# Patient Record
Sex: Female | Born: 1945 | Race: White | Hispanic: No | Marital: Married | Smoking: Never smoker
Health system: Southern US, Community
[De-identification: ages and names within clinical notes are randomized; demographics above are authoritative.]

## PROBLEM LIST (undated history)

## (undated) DIAGNOSIS — K219 Gastro-esophageal reflux disease without esophagitis: Secondary | ICD-10-CM

## (undated) DIAGNOSIS — I10 Essential (primary) hypertension: Secondary | ICD-10-CM

## (undated) DIAGNOSIS — E079 Disorder of thyroid, unspecified: Secondary | ICD-10-CM

## (undated) DIAGNOSIS — E7800 Pure hypercholesterolemia, unspecified: Secondary | ICD-10-CM

## (undated) HISTORY — PX: SHOULDER SURGERY: SHX246

## (undated) HISTORY — PX: ELBOW ARTHROSCOPY: SUR87

## (undated) HISTORY — PX: COLONOSCOPY: SHX174

## (undated) HISTORY — PX: ABDOMINAL HYSTERECTOMY: SUR658

## (undated) HISTORY — PX: CHOLECYSTECTOMY: SHX55

## (undated) HISTORY — PX: FOOT TENDON SURGERY: SHX958

## (undated) HISTORY — PX: FINGER SURGERY: SHX640

---

## 2003-09-23 ENCOUNTER — Other Ambulatory Visit: Admission: RE | Admit: 2003-09-23 | Discharge: 2003-09-23 | Payer: Self-pay | Admitting: Gynecology

## 2003-11-14 ENCOUNTER — Ambulatory Visit (HOSPITAL_BASED_OUTPATIENT_CLINIC_OR_DEPARTMENT_OTHER): Admission: RE | Admit: 2003-11-14 | Discharge: 2003-11-14 | Payer: Self-pay | Admitting: Orthopedic Surgery

## 2003-11-14 ENCOUNTER — Ambulatory Visit (HOSPITAL_COMMUNITY): Admission: RE | Admit: 2003-11-14 | Discharge: 2003-11-14 | Payer: Self-pay | Admitting: Orthopedic Surgery

## 2004-03-26 ENCOUNTER — Ambulatory Visit (HOSPITAL_BASED_OUTPATIENT_CLINIC_OR_DEPARTMENT_OTHER): Admission: RE | Admit: 2004-03-26 | Discharge: 2004-03-26 | Payer: Self-pay | Admitting: Orthopedic Surgery

## 2004-03-26 ENCOUNTER — Ambulatory Visit (HOSPITAL_COMMUNITY): Admission: RE | Admit: 2004-03-26 | Discharge: 2004-03-26 | Payer: Self-pay | Admitting: Orthopedic Surgery

## 2006-01-31 ENCOUNTER — Ambulatory Visit: Payer: Self-pay | Admitting: Gastroenterology

## 2006-02-16 ENCOUNTER — Ambulatory Visit: Payer: Self-pay | Admitting: Gastroenterology

## 2006-03-18 ENCOUNTER — Ambulatory Visit: Payer: Self-pay | Admitting: Gastroenterology

## 2008-07-03 ENCOUNTER — Encounter: Admission: RE | Admit: 2008-07-03 | Discharge: 2008-07-03 | Payer: Self-pay | Admitting: Radiology

## 2010-10-02 NOTE — Assessment & Plan Note (Signed)
[facility]                           GASTROENTEROLOGY OFFICE NOTE   Carmen [name], Carmen [name]                        MRN:          [id number]  DATE:01/31/2006                            DOB:          Mar 31, 1946    PROBLEM:  Diarrhea.   Carmen [name] is a pleasant 65 year old white female complaining of  intermittent urgency with diarrheal stools.  This may occur without warning.  It is not related to specific foods.  It may occur up to twice a week.  There is no history of melena or hematochezia.  She is without abdominal  pain.  Medicines have not changed.  She has had symptoms over the last three  months that have worsened.   PAST MEDICAL HISTORY:  Pertinent for hypertension.  She is status-post  cholecystectomy, tubal ligation, and hysterectomy.   FAMILY HISTORY:  Pertinent for a daughter with breast cancer.   MEDICATIONS:  Include Levoxyl, Caduet, Benicar, Premarin.   SHE IS ALLERGIC TO PENICILLIN AND TETANUS.   She does not smoke.  She drinks rarely.  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   REVIEW OF SYSTEMS:  Positive for sleeping problems and recent anxiety and  depression related to her daughter's illness.   PHYSICAL EXAM:  GENERAL:  She is weepy.  VITAL SIGNS:  Pulse 88, blood pressure 118/76, weight 200.  HEENT: EOMI. PERRLA. Sclerae are anicteric.  Conjunctivae are pink.  NECK:  Supple without thyromegaly, adenopathy or carotid bruits.  CHEST:  Clear to auscultation and percussion without adventitious sounds.  CARDIAC:  Regular rhythm; normal S1 S2.  There are no murmurs, gallops or  rubs.  ABDOMEN:  Bowel sounds are normoactive.  Abdomen is soft, non-tender and non-  distended.  There are no abdominal masses, tenderness, splenic enlargement  or hepatomegaly.  EXTREMITIES:  Full range of motion.  No cyanosis, clubbing or edema.  RECTAL:  Deferred.   IMPRESSION:  1. 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  2. Anxiety/depression.   RECOMMENDATION:  1. Fiber supplementation.  2. Screening colonoscopy.  3. Begin a trial of Lexapro 10 mg daily.                                   Barbette Hair. Arlyce Dice, MD,FACG   RDK/MedQ  DD:  01/31/2006  DT:  01/31/2006  Job #:  [id number]   cc:   Teena Irani. Arlyce Dice, M.D.

## 2010-10-02 NOTE — Op Note (Signed)
NAMEMARENA, [name] NO.:  [id number]   MEDICAL RECORD NO.:  [id number]                   PATIENT TYPE:  AMB   LOCATION:  DSC                                  FACILITY:  [facility]   PHYSICIAN:  Loreta Ave, M.D.              DATE OF BIRTH:  1946-05-04   DATE OF PROCEDURE:  DATE OF DISCHARGE:                                 OPERATIVE REPORT   PREOPERATIVE DIAGNOSES:  1. 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  2. Prominent symptomatic posterior superior os calcis spur and pre-Achilles     bursitis.   POSTOPERATIVE DIAGNOSES:  1. 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  2. Prominent symptomatic posterior superior os calcis spur and pre-Achilles     bursitis.   OPERATIVE PROCEDURE:  1. Extensive debridement and then primary repair, Achilles tendon, with a #2     Fibrewire suture.  2. Excision, pre-Achilles bursa, and removal of symptomatic spur, posterior     superior os calcis.   SURGEON:  Loreta Ave, M.D.   ASSISTANT:  Arlys John D. Petrarca, P.A.-C.   ANESTHESIA:  General.   ESTIMATED BLOOD LOSS:  Minimal.   TOURNIQUET TIME:  45 minutes.   SPECIMENS:  None.   CULTURES:  None.   COMPLICATIONS:  None.   DRESSING:  Soft compressive with a short-leg splint in a plantargrade,  slightly plantar flexed position.   PROCEDURE:  Patient brought to the operating room and after adequate  anesthesia had been obtained, tourniquet applied to the upper aspect of the  right leg.  Turned to a prone position, appropriate padding and support.  Exsanguinated with elevation and Esmarch, tourniquet inflated to 350 mmHg.  A longitudinal incision centered over the area of fusiform swelling of the  Achilles tendon in the watershed region halfway between the musculotendinous  junction  and Achilles attachment.  The incision extended proximal and  distal.  Skin and subcutaneous tissue divided.  Thickened, inflammatory  peritendinous tissue excised.  Fusiform swelling, degeneration over almost  10 cm in the Achilles tendon, but leaving some healthy tendon above and  below this.  The tendon was opened longitudinally.  All of the areas of  partial tearing, inflammatory debris, mucinous degeneration completely  excised back to healthy tissue.  This left a thin rim of tendon all the way  around at completion but a fairly good defect over the central 2 or 3 cm.  At the distal end the pre-Achilles bursa region was exposed through the  slightly lateral incision.  Thickened, inflamed pre-Achilles bursa resected.  Prominent spurring off the os calcis exposed and then taken down, protecting  the distal tendon.  Fluoroscopic confirmation of adequate removal of the  spurs.  Although there was still a spur  up into the central portion of the  tendon, this is not symptomatic and I did not feel I needed to take the  tendon down from the attachment itself.  Wound thoroughly irrigated.  I then  did a repair by running a Fibrewire suture from the longitudinal defect well  proximally into the tendon above the defect with a tendon-capturing stitch.  I then weaved across the longitudinal opening in the tendon with a baseball  suture and then down distally into healthy tendon with a tendon-capturing  stitch, bringing the suture back up into the defect and then primarily  closing this.  This gave a nice, firm repair throughout and utilized the  healthy tendon above and below the defect to well capture and repair the  overall tendon.  At completion I could bring her slightly dorsiflexed  without too much tension on the repair, and I had a nice firm repair with a  nice contoured tendon throughout.  The wound was irrigated.  Closed with  Vicryl and then nylon.  Margins of the wound injected with  Marcaine.  A  sterile compressive dressing and short-leg splint applied.  Tourniquet  deflated and removed.  Returned to the supine position.  Anesthesia  reversed.  Brought to the recovery room.  Tolerated surgery well, no  complications.                                               Loreta Ave, M.D.    DFM/MEDQ  D:  11/14/2003  T:  11/14/2003  Job:  [id number]

## 2010-10-02 NOTE — Assessment & Plan Note (Signed)
[facility]                           GASTROENTEROLOGY OFFICE NOTE   LENNIX, ROTUNDO                        MRN:          [id number]  DATE:03/18/2006                            DOB:          09-17-1945    PROBLEM:  Diarrhea.   Ms. Milanese has returned for scheduled GI followup.  Colonoscopy demonstrated  diverticulosis only.  She claims that her diarrhea has entirely subsided.  She feels it is stress related.  She has stopped her fiber.   PHYSICAL EXAMINATION:  VITAL SIGNS:  Pulse 86, blood pressure 110/60.  Weight 196.   IMPRESSION:  1. Stress-related diarrhea.  2. Diverticulosis.   RECOMMENDATIONS:  Fiber supplementation as needed.     Barbette Hair. Arlyce Dice, MD,FACG  Electronically Signed    RDK/MedQ  DD: 03/18/2006  DT: 03/18/2006  Job #: [id number]   cc:   Titus Dubin. Alwyn Ren, MD,FACP,FCCP

## 2010-10-02 NOTE — Letter (Signed)
January 31, 2006     Mrs. Lovenia Debruler  [address]  [place], [place] [address]   MRN:  [id number]  /  DOB:  March 04, 1946   Dear Mrs. Izola Price:   It is my pleasure to have treated you recently as a new patient in my  office.  I appreciate your confidence and the opportunity to participate in  your care.   Since I do have a busy inpatient endoscopy schedule and office schedule, my  office hours vary weekly.  I am, however, available for emergency calls  every day through my office.  If I cannot promptly meet an urgent office  appointment, another one of our gastroenterologists will be able to assist  you.   My well-trained staff are prepared to help you at all times.  For  emergencies after office hours, a physician from our gastroenterology  section is always available through my 24-hour answering service.   While you are under my care, I encourage discussion of your questions and  concerns, and I will be happy to return your calls as soon as I am  available.   Once again, I welcome you as a new patient and I look forward to a happy and  healthy relationship.   Sincerely,     Barbette Hair. Arlyce Dice, MD,FACG   RDK/MedQ  DD:  01/31/2006  DT:  01/31/2006  Job #:  [id number]

## 2010-10-02 NOTE — Op Note (Signed)
Carmen [name], PINSON NO.:  [id number]   MEDICAL RECORD NO.:  [id number]          PATIENT TYPE:  AMB   LOCATION:  DSC                          FACILITY:  [facility]   PHYSICIAN:  Loreta Ave, M.D. DATE OF BIRTH:  [birth year]   DATE OF PROCEDURE:  03/26/2004  DATE OF DISCHARGE:  03/26/2004                                 OPERATIVE REPORT   PREOPERATIVE DIAGNOSIS:  Delayed staphylococcal infection, three months'  onset, after Achilles tendon repair, right posterior ankle.  Felt to be  superficial.   POSTOPERATIVE DIAGNOSIS:  Delayed staphylococcal infection, three months'  onset, after Achilles tendon repair, right posterior ankle.  Felt to be  superficial.  Extension of superficial infection down into portion of  Achilles tendon but without tendon disruption.   PROCEDURE:  Exploration, debridement, irrigation, lavage, and removal of  buried Fibrewire suture, right posterior ankle.  Loose primary closure over  a Penrose drain.   SURGEON:  Loreta Ave, M.D.   ASSISTANT:  Genene Churn. Denton Meek.   ANESTHESIA:  General.   ESTIMATED BLOOD LOSS:  Minimal.   SPECIMENS:  None.   CULTURES:  None.   COMPLICATIONS:  None.   DRESSING:  Soft compressive with a Cam walker.   TOURNIQUET TIME:  45 minutes.   PROCEDURE:  Patient brought to the operating room and after adequate  anesthesia had been obtained, appropriate padding and support, turned to a  prone position, prepped and draped in the usual sterile fashion.  Exsanguinated with elevation and Esmarch, tourniquet inflated to 300 mmHg.  The incision over the Achilles tendon was opened in the central portion,  elliptically excising three sinus tracts, two of which had an exuberant  amount of granulation tissue.  Just 0.5 mL of purulence emanating from the  subcutaneous tissue.  Once these were excised, the central portion of the  incision.  Extensive amount of necrotic debris, some cloudy fluid, but no  gross purulence.  Most of the necrotic debris was in the peritenon tissue.  Some did extend down into the Achilles with a scalloping superficial erosion  of the Achilles.  It also seemed to track down into where the Fibrewire  suture had been placed.  Her repair was distal, and this infection was at  the proximal end over the Achilles.  No disruption of the longitudinal  continuity.  Extensive irrigation of all necrotic debris.  Fibrewire suture  removed.  The tendon was completely curetted out of abnormal debris.  All of  the necrotic peritenon removed.  Copious irrigation with 6 L of saline with  pulse lavage.  The tourniquet was deflated.  Exuberant bleeding began around  the area, which was very encouraging for later healing.  The tourniquet was  reinflated, then the wound was re-irrigated with another 3 L of saline.  I  felt that I had removed the foreign body of the suture as well as all  necrotic debris.  I placed a Penrose drain over the tendon, bringing it out  proximally.  The skin and subcutaneous tissue were then closed with PDS  retention-type sutures.  Sterile compressive dressing applied.  Cam walker  applied.  Tourniquet removed.  Returned to the supine position.  Anesthesia  reversed.  Brought to the recovery room.  Tolerated the surgery well with no  complications.      Valentino Saxon   DFM/MEDQ  D:  03/27/2004  T:  03/28/2004  Job:  [id number]

## 2011-05-20 DIAGNOSIS — H524 Presbyopia: Secondary | ICD-10-CM | POA: Diagnosis not present

## 2011-05-20 DIAGNOSIS — H2510 Age-related nuclear cataract, unspecified eye: Secondary | ICD-10-CM | POA: Diagnosis not present

## 2011-06-16 DIAGNOSIS — E039 Hypothyroidism, unspecified: Secondary | ICD-10-CM | POA: Diagnosis not present

## 2011-06-16 DIAGNOSIS — R21 Rash and other nonspecific skin eruption: Secondary | ICD-10-CM | POA: Diagnosis not present

## 2011-06-16 DIAGNOSIS — I10 Essential (primary) hypertension: Secondary | ICD-10-CM | POA: Diagnosis not present

## 2011-06-16 DIAGNOSIS — E785 Hyperlipidemia, unspecified: Secondary | ICD-10-CM | POA: Diagnosis not present

## 2011-06-16 DIAGNOSIS — R7301 Impaired fasting glucose: Secondary | ICD-10-CM | POA: Diagnosis not present

## 2011-11-25 DIAGNOSIS — R920 Mammographic microcalcification found on diagnostic imaging of breast: Secondary | ICD-10-CM | POA: Diagnosis not present

## 2011-12-13 ENCOUNTER — Telehealth: Payer: Self-pay | Admitting: *Deleted

## 2011-12-13 DIAGNOSIS — K219 Gastro-esophageal reflux disease without esophagitis: Secondary | ICD-10-CM | POA: Diagnosis not present

## 2011-12-13 DIAGNOSIS — E785 Hyperlipidemia, unspecified: Secondary | ICD-10-CM | POA: Diagnosis not present

## 2011-12-13 DIAGNOSIS — I10 Essential (primary) hypertension: Secondary | ICD-10-CM | POA: Diagnosis not present

## 2011-12-13 DIAGNOSIS — E039 Hypothyroidism, unspecified: Secondary | ICD-10-CM | POA: Diagnosis not present

## 2011-12-13 NOTE — Telephone Encounter (Signed)
Chart ordered to review.

## 2011-12-16 NOTE — Telephone Encounter (Signed)
Pt had colon with Dr. Arlyce Dice 02/16/2006. Need to know when pt due for recall. Please advise.

## 2011-12-16 NOTE — Telephone Encounter (Signed)
2017

## 2011-12-16 NOTE — Telephone Encounter (Signed)
Spoke with Crystal and she is aware.

## 2012-02-01 DIAGNOSIS — Z23 Encounter for immunization: Secondary | ICD-10-CM | POA: Diagnosis not present

## 2012-03-20 DIAGNOSIS — Z0000 Encounter for general adult medical examination without abnormal findings: Secondary | ICD-10-CM | POA: Diagnosis not present

## 2012-03-20 DIAGNOSIS — Z23 Encounter for immunization: Secondary | ICD-10-CM | POA: Diagnosis not present

## 2012-05-29 DIAGNOSIS — B9789 Other viral agents as the cause of diseases classified elsewhere: Secondary | ICD-10-CM | POA: Diagnosis not present

## 2012-05-29 DIAGNOSIS — R030 Elevated blood-pressure reading, without diagnosis of hypertension: Secondary | ICD-10-CM | POA: Diagnosis not present

## 2012-06-20 DIAGNOSIS — E785 Hyperlipidemia, unspecified: Secondary | ICD-10-CM | POA: Diagnosis not present

## 2012-06-20 DIAGNOSIS — E039 Hypothyroidism, unspecified: Secondary | ICD-10-CM | POA: Diagnosis not present

## 2012-06-20 DIAGNOSIS — J069 Acute upper respiratory infection, unspecified: Secondary | ICD-10-CM | POA: Diagnosis not present

## 2012-06-20 DIAGNOSIS — I10 Essential (primary) hypertension: Secondary | ICD-10-CM | POA: Diagnosis not present

## 2012-06-26 DIAGNOSIS — N951 Menopausal and female climacteric states: Secondary | ICD-10-CM | POA: Diagnosis not present

## 2012-06-26 DIAGNOSIS — Z1212 Encounter for screening for malignant neoplasm of rectum: Secondary | ICD-10-CM | POA: Diagnosis not present

## 2012-06-26 DIAGNOSIS — E349 Endocrine disorder, unspecified: Secondary | ICD-10-CM | POA: Diagnosis not present

## 2012-11-28 DIAGNOSIS — R928 Other abnormal and inconclusive findings on diagnostic imaging of breast: Secondary | ICD-10-CM | POA: Diagnosis not present

## 2012-12-12 DIAGNOSIS — E039 Hypothyroidism, unspecified: Secondary | ICD-10-CM | POA: Diagnosis not present

## 2012-12-12 DIAGNOSIS — R1310 Dysphagia, unspecified: Secondary | ICD-10-CM | POA: Diagnosis not present

## 2012-12-12 DIAGNOSIS — R7301 Impaired fasting glucose: Secondary | ICD-10-CM | POA: Diagnosis not present

## 2012-12-12 DIAGNOSIS — E785 Hyperlipidemia, unspecified: Secondary | ICD-10-CM | POA: Diagnosis not present

## 2012-12-12 DIAGNOSIS — I10 Essential (primary) hypertension: Secondary | ICD-10-CM | POA: Diagnosis not present

## 2012-12-15 ENCOUNTER — Ambulatory Visit (INDEPENDENT_AMBULATORY_CARE_PROVIDER_SITE_OTHER): Payer: Medicare Other | Admitting: Gastroenterology

## 2012-12-15 ENCOUNTER — Encounter: Payer: Self-pay | Admitting: Gastroenterology

## 2012-12-15 VITALS — BP 120/80 | HR 80 | Ht 67.5 in | Wt 205.4 lb

## 2012-12-15 DIAGNOSIS — K219 Gastro-esophageal reflux disease without esophagitis: Secondary | ICD-10-CM | POA: Diagnosis not present

## 2012-12-15 DIAGNOSIS — R1310 Dysphagia, unspecified: Secondary | ICD-10-CM | POA: Insufficient documentation

## 2012-12-15 NOTE — Progress Notes (Signed)
History of Present Illness: Pleasant 67 year old white female referred for evaluation of dysphagia. She's been suffering from dysphagia solids and, occasionally, liquids. She also has had immediate postprandial vomiting. She is unsure whether food has lodged in her esophagus. She has occasional pyrosis.  She was recently placed on dexilant. With dysphagia she might have odoynophagia.    History reviewed. No pertinent past medical history. Past Surgical History  Procedure Laterality Date  . Cholecystectomy    . Abdominal hysterectomy    . Shoulder surgery    . Foot tendon surgery    . Elbow arthroscopy    . Finger surgery     family history includes Diabetes in her father. Current Outpatient Prescriptions  Medication Sig Dispense Refill  . amLODipine (NORVASC) 5 MG tablet       . atorvastatin (LIPITOR) 80 MG tablet       . Coenzyme Q10 (COQ10) 100 MG CAPS Take by mouth.      . dexlansoprazole (DEXILANT) 60 MG capsule Take 60 mg by mouth daily.      Marland Kitchen estradiol (ESTRACE) 1 MG tablet       . Fish Oil-Cholecalciferol (FISH OIL + D3) 1000-1000 MG-UNIT CAPS Take by mouth.      . levothyroxine (SYNTHROID, LEVOTHROID) 75 MCG tablet Take 75 mcg by mouth daily before breakfast.      . losartan (COZAAR) 100 MG tablet       . Misc Natural Products (GLUCOSAMINE CHONDROITIN ADV PO) Take by mouth.      . Multiple Vitamins-Minerals (MULTIVITAMIN PO) Take by mouth.      Marland Kitchen POTASSIUM CHLORIDE PO Take by mouth.       No current facility-administered medications for this visit.   Allergies as of 12/15/2012 - Review Complete 12/15/2012  Allergen Reaction Noted  . Penicillins Other (See Comments) 12/15/2012  . Tetanus toxoids Other (See Comments) 12/15/2012  . Adhesive (tape) Rash 12/15/2012    reports that she has never smoked. She has never used smokeless tobacco. She reports that she drinks about 1.2 ounces of alcohol per week. She reports that she does not use illicit drugs.     Review of  Systems: Pertinent positive and negative review of systems were noted in the above HPI section. All other review of systems were otherwise negative.  Vital signs were reviewed in today's medical record Physical Exam: General: Well developed , well nourished, no acute distress Skin: anicteric Head: Normocephalic and atraumatic Eyes:  sclerae anicteric, EOMI Ears: Normal auditory acuity Mouth: No deformity or lesions Neck: Supple, no masses or thyromegaly Lungs: Clear throughout to auscultation Heart: Regular rate and rhythm; no murmurs, rubs or bruits Abdomen: Soft, non tender and non distended. No masses, hepatosplenomegaly or hernias noted. Normal Bowel sounds Rectal:deferred Musculoskeletal: Symmetrical with no gross deformities  Skin: No lesions on visible extremities Pulses:  Normal pulses noted Extremities: No clubbing, cyanosis, edema or deformities noted Neurological: Alert oriented x 4, grossly nonfocal Cervical Nodes:  No significant cervical adenopathy Inguinal Nodes: No significant inguinal adenopathy Psychological:  Alert and cooperative. Normal mood and affect

## 2012-12-15 NOTE — Assessment & Plan Note (Signed)
Patient has moderate pyrosis. Plan to continue dexilant.

## 2012-12-15 NOTE — Patient Instructions (Addendum)

## 2012-12-15 NOTE — Assessment & Plan Note (Signed)
Gradual onset of dysphagia probably secondary to esophageal stricture.  Recommendations #1 upper endoscopy with dilatation as indicated

## 2012-12-21 ENCOUNTER — Encounter: Payer: Self-pay | Admitting: Family Medicine

## 2013-01-18 ENCOUNTER — Telehealth: Payer: Self-pay | Admitting: Gastroenterology

## 2013-01-18 NOTE — Telephone Encounter (Signed)
no

## 2013-01-19 ENCOUNTER — Encounter: Payer: Medicare Other | Admitting: Gastroenterology

## 2013-01-24 ENCOUNTER — Ambulatory Visit (AMBULATORY_SURGERY_CENTER): Payer: Medicare Other | Admitting: Gastroenterology

## 2013-01-24 ENCOUNTER — Encounter: Payer: Self-pay | Admitting: Gastroenterology

## 2013-01-24 VITALS — BP 134/74 | HR 82 | Temp 97.1°F | Resp 17 | Ht 67.0 in | Wt 205.0 lb

## 2013-01-24 DIAGNOSIS — R1310 Dysphagia, unspecified: Secondary | ICD-10-CM | POA: Diagnosis not present

## 2013-01-24 MED ORDER — SODIUM CHLORIDE 0.9 % IV SOLN: 500.0000 mL | INTRAVENOUS | Status: DC

## 2013-01-24 MED ORDER — OMEPRAZOLE 20 MG PO CPDR: 20.0000 mg | DELAYED_RELEASE_CAPSULE | Freq: Every day | ORAL | Status: AC

## 2013-01-24 NOTE — Progress Notes (Signed)
Pt was tearful off and on while in the recovery room.  I asked her if she was ok.  Pt states, "no, I am ok".  Offered her tissues and she took them.  Again I asked if she was uncomfortable and she said, " my throat is a little scratchy".  I advised her this was normal after the esophageal dilatatation.  She might try a warm water gargle.  Maw

## 2013-01-24 NOTE — Progress Notes (Signed)
Called to room to assist during endoscopic procedure.  Patient ID and intended procedure confirmed with present staff. Received instructions for my participation in the procedure from the performing physician.  

## 2013-01-24 NOTE — Op Note (Signed)
[facility] [address] [place], [address]   ENDOSCOPY PROCEDURE REPORT  PATIENT: Carmen [name], Carmen [name]  MR#: [id number] BIRTHDATE: 11/10/1945 , 67  yrs. old GENDER: Female ENDOSCOPIST: Louis Meckel, MD REFERRED BY:  Halina Maidens, M.D. PROCEDURE DATE:  01/24/2013 PROCEDURE:  EGD, diagnostic and Maloney dilation of esophagus ASA CLASS:     Class II INDICATIONS:  Dysphagia. MEDICATIONS: MAC sedation, administered by CRNA, propofol (Diprivan) 150mg  IV, and Simethicone 0.6cc PO TOPICAL ANESTHETIC: Cetacaine Spray  DESCRIPTION OF PROCEDURE: After the risks benefits and alternatives of the procedure were thoroughly explained, informed consent was obtained.  The LB [id number] endoscope was introduced through the mouth and advanced to the third portion of the duodenum. Without limitations.  The instrument was slowly withdrawn as the mucosa was fully examined.      A moderate esophageal stricture was seen at the GE junction.  Small hiatal hernia was present.  The 9.8 mm gastroscope easily traversed the stricture.  In the stomach there are multiple fundic gland-appearing polyps in the fundus measuring 1-2 mm.  Overlying mucosa was normal. The remainder of the upper endoscopy exam was otherwise normal.  Retroflexed views revealed no abnormalities. The scope was then withdrawn from the patient.  A #52 Jerene Dilling dilator was passed with mild resistance.  There was no heme.  COMPLICATIONS: There were no complications. ENDOSCOPIC IMPRESSION: 1.   esophageal stricture-status post Maloney dilation 2.  fundic gland polyps  RECOMMENDATIONS: begin omeprazole 20 mg daily (d/c dexilant) REPEAT EXAM:  eSigned:  Louis Meckel, MD 01/24/2013 4:03 PM   CC:

## 2013-01-24 NOTE — Patient Instructions (Addendum)
YOU HAD AN ENDOSCOPIC PROCEDURE TODAY AT THE [facility]: Refer to the procedure report that was given to you for any specific questions about what was found during the examination.  If the procedure report does not answer your questions, please call your gastroenterologist to clarify.  If you requested that your care partner not be given the details of your procedure findings, then the procedure report has been included in a sealed envelope for you to review at your convenience later.  YOU SHOULD EXPECT: Some feelings of bloating in the abdomen. Passage of more gas than usual.  Walking can help get rid of the air that was put into your GI tract during the procedure and reduce the bloating. If you had a lower endoscopy (such as a colonoscopy or flexible sigmoidoscopy) you may notice spotting of blood in your stool or on the toilet paper. If you underwent a bowel prep for your procedure, then you may not have a normal bowel movement for a few days.  DIET:  Drink plenty of fluids but you should avoid alcoholic beverages for 24 hours.  Please follow the dilatation diet the rest of the day.  ACTIVITY: Your care partner should take you home directly after the procedure.  You should plan to take it easy, moving slowly for the rest of the day.  You can resume normal activity the day after the procedure however you should NOT DRIVE or use heavy machinery for 24 hours (because of the sedation medicines used during the test).    SYMPTOMS TO REPORT IMMEDIATELY: A gastroenterologist can be reached at any hour.  During normal business hours, 8:30 AM to 5:00 PM Monday through Friday, call [phone].  After hours and on weekends, please call the GI answering service at [phone] who will take a message and have the physician on call contact you.     Following upper endoscopy (EGD)  Vomiting of blood or coffee ground material  New chest pain or pain under the shoulder blades  Painful or  persistently difficult swallowing  New shortness of breath  Fever of 100F or higher  Black, tarry-looking stools  FOLLOW UP: If any biopsies were taken you will be contacted by phone or by letter within the next 1-3 weeks.  Call your gastroenterologist if you have not heard about the biopsies in 3 weeks.  Our staff will call the home number listed on your records the next business day following your procedure to check on you and address any questions or concerns that you may have at that time regarding the information given to you following your procedure. This is a courtesy call and so if there is no answer at the home number and we have not heard from you through the emergency physician on call, we will assume that you have returned to your regular daily activities without incident.  SIGNATURES/CONFIDENTIALITY: You and/or your care partner have signed paperwork which will be entered into your electronic medical record.  These signatures attest to the fact that that the information above on your After Visit Summary has been reviewed and is understood.  Full responsibility of the confidentiality of this discharge information lies with you and/or your care-partner.    Please follow the dilatation diet the rest of the day. Begin Omeprazole 20 mg daily and discontinue dexilant.  You may resume your other current medications today also. Please call if any questions or concerns.

## 2013-01-25 ENCOUNTER — Telehealth: Payer: Self-pay | Admitting: *Deleted

## 2013-01-25 NOTE — Telephone Encounter (Signed)
  Follow up Call-  Call back number 01/24/2013  Post procedure Call Back phone  # [phone]  Permission to leave phone message Yes     Patient questions:  Do you have a fever, pain , or abdominal swelling? no Pain Score  0 *  Have you tolerated food without any problems? yes  Have you been able to return to your normal activities? yes  Do you have any questions about your discharge instructions: Diet   no Medications  no Follow up visit  no  Do you have questions or concerns about your Care? no  Actions: * If pain score is 4 or above: No action needed, pain <4.  Pt said she did not have much of an appetite, had some chocolate milk, but would eat this am and if any problems she would call us back.

## 2013-04-03 DIAGNOSIS — R7309 Other abnormal glucose: Secondary | ICD-10-CM | POA: Diagnosis not present

## 2013-04-03 DIAGNOSIS — E039 Hypothyroidism, unspecified: Secondary | ICD-10-CM | POA: Diagnosis not present

## 2013-04-03 DIAGNOSIS — M79609 Pain in unspecified limb: Secondary | ICD-10-CM | POA: Diagnosis not present

## 2013-04-03 DIAGNOSIS — E785 Hyperlipidemia, unspecified: Secondary | ICD-10-CM | POA: Diagnosis not present

## 2013-04-03 DIAGNOSIS — K219 Gastro-esophageal reflux disease without esophagitis: Secondary | ICD-10-CM | POA: Diagnosis not present

## 2013-04-03 DIAGNOSIS — I10 Essential (primary) hypertension: Secondary | ICD-10-CM | POA: Diagnosis not present

## 2013-04-03 DIAGNOSIS — Z23 Encounter for immunization: Secondary | ICD-10-CM | POA: Diagnosis not present

## 2013-04-04 ENCOUNTER — Other Ambulatory Visit (HOSPITAL_COMMUNITY): Payer: Self-pay | Admitting: Family Medicine

## 2013-04-04 ENCOUNTER — Ambulatory Visit (HOSPITAL_COMMUNITY)
Admission: RE | Admit: 2013-04-04 | Discharge: 2013-04-04 | Disposition: A | Discharge status: Home / Self Care | Payer: Medicare Other | Source: Ambulatory Visit | Attending: Cardiovascular Disease | Admitting: Cardiovascular Disease

## 2013-04-04 DIAGNOSIS — M79609 Pain in unspecified limb: Secondary | ICD-10-CM

## 2013-04-04 NOTE — Progress Notes (Signed)
Right Lower Extremity Venous Duplex Completed. Negative for DVT and SVT. °Brianna L Mazza,RVT °

## 2013-04-11 DIAGNOSIS — S61209A Unspecified open wound of unspecified finger without damage to nail, initial encounter: Secondary | ICD-10-CM | POA: Diagnosis not present

## 2013-05-23 DIAGNOSIS — J069 Acute upper respiratory infection, unspecified: Secondary | ICD-10-CM | POA: Diagnosis not present

## 2013-05-28 DIAGNOSIS — H35369 Drusen (degenerative) of macula, unspecified eye: Secondary | ICD-10-CM | POA: Diagnosis not present

## 2013-07-09 DIAGNOSIS — Z1212 Encounter for screening for malignant neoplasm of rectum: Secondary | ICD-10-CM | POA: Diagnosis not present

## 2013-07-09 DIAGNOSIS — Z1289 Encounter for screening for malignant neoplasm of other sites: Secondary | ICD-10-CM | POA: Diagnosis not present

## 2013-07-11 DIAGNOSIS — E039 Hypothyroidism, unspecified: Secondary | ICD-10-CM | POA: Diagnosis not present

## 2013-07-11 DIAGNOSIS — R7309 Other abnormal glucose: Secondary | ICD-10-CM | POA: Diagnosis not present

## 2013-07-11 DIAGNOSIS — R7301 Impaired fasting glucose: Secondary | ICD-10-CM | POA: Diagnosis not present

## 2013-07-11 DIAGNOSIS — K219 Gastro-esophageal reflux disease without esophagitis: Secondary | ICD-10-CM | POA: Diagnosis not present

## 2013-07-11 DIAGNOSIS — I10 Essential (primary) hypertension: Secondary | ICD-10-CM | POA: Diagnosis not present

## 2013-07-11 DIAGNOSIS — E785 Hyperlipidemia, unspecified: Secondary | ICD-10-CM | POA: Diagnosis not present

## 2013-07-16 DIAGNOSIS — H35369 Drusen (degenerative) of macula, unspecified eye: Secondary | ICD-10-CM | POA: Diagnosis not present

## 2013-10-30 DIAGNOSIS — I10 Essential (primary) hypertension: Secondary | ICD-10-CM | POA: Diagnosis not present

## 2013-10-30 DIAGNOSIS — E039 Hypothyroidism, unspecified: Secondary | ICD-10-CM | POA: Diagnosis not present

## 2013-10-30 DIAGNOSIS — E785 Hyperlipidemia, unspecified: Secondary | ICD-10-CM | POA: Diagnosis not present

## 2013-11-06 DIAGNOSIS — R7309 Other abnormal glucose: Secondary | ICD-10-CM | POA: Diagnosis not present

## 2013-12-12 DIAGNOSIS — R928 Other abnormal and inconclusive findings on diagnostic imaging of breast: Secondary | ICD-10-CM | POA: Diagnosis not present

## 2013-12-12 DIAGNOSIS — R21 Rash and other nonspecific skin eruption: Secondary | ICD-10-CM | POA: Diagnosis not present

## 2013-12-12 DIAGNOSIS — Z803 Family history of malignant neoplasm of breast: Secondary | ICD-10-CM | POA: Diagnosis not present

## 2013-12-25 DIAGNOSIS — L2089 Other atopic dermatitis: Secondary | ICD-10-CM | POA: Diagnosis not present

## 2013-12-25 DIAGNOSIS — D219 Benign neoplasm of connective and other soft tissue, unspecified: Secondary | ICD-10-CM | POA: Diagnosis not present

## 2014-01-18 ENCOUNTER — Ambulatory Visit: Payer: Medicare Other | Admitting: Internal Medicine

## 2014-03-18 DIAGNOSIS — L309 Dermatitis, unspecified: Secondary | ICD-10-CM | POA: Diagnosis not present

## 2014-07-02 DIAGNOSIS — N183 Chronic kidney disease, stage 3 (moderate): Secondary | ICD-10-CM | POA: Diagnosis not present

## 2014-07-02 DIAGNOSIS — E669 Obesity, unspecified: Secondary | ICD-10-CM | POA: Diagnosis not present

## 2014-07-02 DIAGNOSIS — E039 Hypothyroidism, unspecified: Secondary | ICD-10-CM | POA: Diagnosis not present

## 2014-07-02 DIAGNOSIS — R7989 Other specified abnormal findings of blood chemistry: Secondary | ICD-10-CM | POA: Diagnosis not present

## 2014-07-02 DIAGNOSIS — E785 Hyperlipidemia, unspecified: Secondary | ICD-10-CM | POA: Diagnosis not present

## 2014-07-02 DIAGNOSIS — I10 Essential (primary) hypertension: Secondary | ICD-10-CM | POA: Diagnosis not present

## 2014-07-02 DIAGNOSIS — R7309 Other abnormal glucose: Secondary | ICD-10-CM | POA: Diagnosis not present

## 2014-07-17 DIAGNOSIS — H35363 Drusen (degenerative) of macula, bilateral: Secondary | ICD-10-CM | POA: Diagnosis not present

## 2014-08-30 DIAGNOSIS — M79662 Pain in left lower leg: Secondary | ICD-10-CM | POA: Diagnosis not present

## 2014-09-02 ENCOUNTER — Other Ambulatory Visit: Payer: Self-pay | Admitting: Family Medicine

## 2014-09-02 ENCOUNTER — Ambulatory Visit
Admission: RE | Admit: 2014-09-02 | Discharge: 2014-09-02 | Disposition: A | Discharge status: Home / Self Care | Payer: Medicare Other | Source: Ambulatory Visit | Attending: Family Medicine | Admitting: Family Medicine

## 2014-09-02 DIAGNOSIS — M79662 Pain in left lower leg: Secondary | ICD-10-CM

## 2014-11-01 DIAGNOSIS — E785 Hyperlipidemia, unspecified: Secondary | ICD-10-CM | POA: Diagnosis not present

## 2014-11-01 DIAGNOSIS — R7309 Other abnormal glucose: Secondary | ICD-10-CM | POA: Diagnosis not present

## 2014-11-01 DIAGNOSIS — Z0000 Encounter for general adult medical examination without abnormal findings: Secondary | ICD-10-CM | POA: Diagnosis not present

## 2014-11-01 DIAGNOSIS — I10 Essential (primary) hypertension: Secondary | ICD-10-CM | POA: Diagnosis not present

## 2014-11-01 DIAGNOSIS — E669 Obesity, unspecified: Secondary | ICD-10-CM | POA: Diagnosis not present

## 2014-11-01 DIAGNOSIS — E039 Hypothyroidism, unspecified: Secondary | ICD-10-CM | POA: Diagnosis not present

## 2014-12-16 DIAGNOSIS — Z1231 Encounter for screening mammogram for malignant neoplasm of breast: Secondary | ICD-10-CM | POA: Diagnosis not present

## 2015-05-09 DIAGNOSIS — I10 Essential (primary) hypertension: Secondary | ICD-10-CM | POA: Diagnosis not present

## 2015-05-09 DIAGNOSIS — R7309 Other abnormal glucose: Secondary | ICD-10-CM | POA: Diagnosis not present

## 2015-05-09 DIAGNOSIS — R5383 Other fatigue: Secondary | ICD-10-CM | POA: Diagnosis not present

## 2015-05-09 DIAGNOSIS — E039 Hypothyroidism, unspecified: Secondary | ICD-10-CM | POA: Diagnosis not present

## 2015-05-09 DIAGNOSIS — Z23 Encounter for immunization: Secondary | ICD-10-CM | POA: Diagnosis not present

## 2015-05-09 DIAGNOSIS — R7303 Prediabetes: Secondary | ICD-10-CM | POA: Diagnosis not present

## 2015-05-09 DIAGNOSIS — E785 Hyperlipidemia, unspecified: Secondary | ICD-10-CM | POA: Diagnosis not present

## 2015-05-16 ENCOUNTER — Telehealth: Payer: Self-pay | Admitting: Gastroenterology

## 2015-05-16 NOTE — Telephone Encounter (Signed)
No history in the chart.  OK to schedule unless patient states they have had a recent colon.

## 2015-05-16 NOTE — Telephone Encounter (Signed)
Per Barbera Setters, patient will not be due for next colon until 02/2016

## 2015-08-13 DIAGNOSIS — H35033 Hypertensive retinopathy, bilateral: Secondary | ICD-10-CM | POA: Diagnosis not present

## 2015-10-07 DIAGNOSIS — J209 Acute bronchitis, unspecified: Secondary | ICD-10-CM | POA: Diagnosis not present

## 2015-12-23 DIAGNOSIS — M85852 Other specified disorders of bone density and structure, left thigh: Secondary | ICD-10-CM | POA: Diagnosis not present

## 2015-12-23 DIAGNOSIS — Z1231 Encounter for screening mammogram for malignant neoplasm of breast: Secondary | ICD-10-CM | POA: Diagnosis not present

## 2015-12-23 DIAGNOSIS — Z803 Family history of malignant neoplasm of breast: Secondary | ICD-10-CM | POA: Diagnosis not present

## 2015-12-23 DIAGNOSIS — Z780 Asymptomatic menopausal state: Secondary | ICD-10-CM | POA: Diagnosis not present

## 2015-12-26 ENCOUNTER — Encounter: Payer: Self-pay | Admitting: *Deleted

## 2015-12-29 DIAGNOSIS — Z23 Encounter for immunization: Secondary | ICD-10-CM | POA: Diagnosis not present

## 2015-12-29 DIAGNOSIS — E039 Hypothyroidism, unspecified: Secondary | ICD-10-CM | POA: Diagnosis not present

## 2015-12-29 DIAGNOSIS — E785 Hyperlipidemia, unspecified: Secondary | ICD-10-CM | POA: Diagnosis not present

## 2015-12-29 DIAGNOSIS — R7309 Other abnormal glucose: Secondary | ICD-10-CM | POA: Diagnosis not present

## 2015-12-29 DIAGNOSIS — I10 Essential (primary) hypertension: Secondary | ICD-10-CM | POA: Diagnosis not present

## 2015-12-29 DIAGNOSIS — Z0000 Encounter for general adult medical examination without abnormal findings: Secondary | ICD-10-CM | POA: Diagnosis not present

## 2015-12-31 DIAGNOSIS — M7661 Achilles tendinitis, right leg: Secondary | ICD-10-CM | POA: Diagnosis not present

## 2016-01-08 ENCOUNTER — Encounter: Payer: Self-pay | Admitting: Gastroenterology

## 2016-01-13 DIAGNOSIS — M7661 Achilles tendinitis, right leg: Secondary | ICD-10-CM | POA: Diagnosis not present

## 2016-03-08 DIAGNOSIS — Z1211 Encounter for screening for malignant neoplasm of colon: Secondary | ICD-10-CM | POA: Diagnosis not present

## 2016-03-08 DIAGNOSIS — R1032 Left lower quadrant pain: Secondary | ICD-10-CM | POA: Diagnosis not present

## 2016-03-12 ENCOUNTER — Other Ambulatory Visit: Payer: Self-pay | Admitting: Gastroenterology

## 2016-03-12 DIAGNOSIS — R1032 Left lower quadrant pain: Secondary | ICD-10-CM

## 2016-03-14 DIAGNOSIS — Z23 Encounter for immunization: Secondary | ICD-10-CM | POA: Diagnosis not present

## 2016-03-15 ENCOUNTER — Ambulatory Visit
Admission: RE | Admit: 2016-03-15 | Discharge: 2016-03-15 | Disposition: A | Discharge status: Home / Self Care | Payer: Medicare Other | Source: Ambulatory Visit | Attending: Gastroenterology | Admitting: Gastroenterology

## 2016-03-15 DIAGNOSIS — K5730 Diverticulosis of large intestine without perforation or abscess without bleeding: Secondary | ICD-10-CM | POA: Diagnosis not present

## 2016-03-15 DIAGNOSIS — M7661 Achilles tendinitis, right leg: Secondary | ICD-10-CM | POA: Diagnosis not present

## 2016-03-15 DIAGNOSIS — R1032 Left lower quadrant pain: Secondary | ICD-10-CM

## 2016-03-15 DIAGNOSIS — R197 Diarrhea, unspecified: Secondary | ICD-10-CM | POA: Diagnosis not present

## 2016-05-07 DIAGNOSIS — M7661 Achilles tendinitis, right leg: Secondary | ICD-10-CM | POA: Diagnosis not present

## 2016-05-19 DIAGNOSIS — H35033 Hypertensive retinopathy, bilateral: Secondary | ICD-10-CM | POA: Diagnosis not present

## 2016-05-19 DIAGNOSIS — H2513 Age-related nuclear cataract, bilateral: Secondary | ICD-10-CM | POA: Diagnosis not present

## 2016-05-19 DIAGNOSIS — H35363 Drusen (degenerative) of macula, bilateral: Secondary | ICD-10-CM | POA: Diagnosis not present

## 2016-05-19 DIAGNOSIS — M7661 Achilles tendinitis, right leg: Secondary | ICD-10-CM | POA: Diagnosis not present

## 2016-05-26 DIAGNOSIS — Z1211 Encounter for screening for malignant neoplasm of colon: Secondary | ICD-10-CM | POA: Diagnosis not present

## 2016-05-26 DIAGNOSIS — E785 Hyperlipidemia, unspecified: Secondary | ICD-10-CM | POA: Diagnosis not present

## 2016-05-26 DIAGNOSIS — M7661 Achilles tendinitis, right leg: Secondary | ICD-10-CM | POA: Diagnosis not present

## 2016-05-26 DIAGNOSIS — M6701 Short Achilles tendon (acquired), right ankle: Secondary | ICD-10-CM | POA: Diagnosis not present

## 2016-05-26 DIAGNOSIS — M9261 Juvenile osteochondrosis of tarsus, right ankle: Secondary | ICD-10-CM | POA: Diagnosis not present

## 2016-05-26 DIAGNOSIS — I10 Essential (primary) hypertension: Secondary | ICD-10-CM | POA: Diagnosis not present

## 2016-05-26 DIAGNOSIS — E039 Hypothyroidism, unspecified: Secondary | ICD-10-CM | POA: Diagnosis not present

## 2016-05-26 DIAGNOSIS — K5792 Diverticulitis of intestine, part unspecified, without perforation or abscess without bleeding: Secondary | ICD-10-CM | POA: Diagnosis not present

## 2016-05-26 DIAGNOSIS — R7303 Prediabetes: Secondary | ICD-10-CM | POA: Diagnosis not present

## 2016-08-30 DIAGNOSIS — K5730 Diverticulosis of large intestine without perforation or abscess without bleeding: Secondary | ICD-10-CM | POA: Diagnosis not present

## 2016-08-30 DIAGNOSIS — K621 Rectal polyp: Secondary | ICD-10-CM | POA: Diagnosis not present

## 2016-08-30 DIAGNOSIS — K635 Polyp of colon: Secondary | ICD-10-CM | POA: Diagnosis not present

## 2016-08-30 DIAGNOSIS — Z1211 Encounter for screening for malignant neoplasm of colon: Secondary | ICD-10-CM | POA: Diagnosis not present

## 2016-09-06 DIAGNOSIS — K635 Polyp of colon: Secondary | ICD-10-CM | POA: Diagnosis not present

## 2016-09-06 DIAGNOSIS — Z1211 Encounter for screening for malignant neoplasm of colon: Secondary | ICD-10-CM | POA: Diagnosis not present

## 2016-12-22 DIAGNOSIS — Z803 Family history of malignant neoplasm of breast: Secondary | ICD-10-CM | POA: Diagnosis not present

## 2016-12-22 DIAGNOSIS — Z1231 Encounter for screening mammogram for malignant neoplasm of breast: Secondary | ICD-10-CM | POA: Diagnosis not present

## 2016-12-25 DIAGNOSIS — R0789 Other chest pain: Secondary | ICD-10-CM | POA: Diagnosis not present

## 2016-12-25 DIAGNOSIS — T887XXA Unspecified adverse effect of drug or medicament, initial encounter: Secondary | ICD-10-CM | POA: Diagnosis not present

## 2017-01-12 DIAGNOSIS — M9261 Juvenile osteochondrosis of tarsus, right ankle: Secondary | ICD-10-CM | POA: Diagnosis not present

## 2017-01-12 DIAGNOSIS — M7661 Achilles tendinitis, right leg: Secondary | ICD-10-CM | POA: Diagnosis not present

## 2017-01-12 DIAGNOSIS — M6701 Short Achilles tendon (acquired), right ankle: Secondary | ICD-10-CM | POA: Diagnosis not present

## 2017-01-14 DIAGNOSIS — E785 Hyperlipidemia, unspecified: Secondary | ICD-10-CM | POA: Diagnosis not present

## 2017-01-14 DIAGNOSIS — R7303 Prediabetes: Secondary | ICD-10-CM | POA: Diagnosis not present

## 2017-01-14 DIAGNOSIS — E039 Hypothyroidism, unspecified: Secondary | ICD-10-CM | POA: Diagnosis not present

## 2017-01-14 DIAGNOSIS — F5101 Primary insomnia: Secondary | ICD-10-CM | POA: Diagnosis not present

## 2017-01-14 DIAGNOSIS — Z0000 Encounter for general adult medical examination without abnormal findings: Secondary | ICD-10-CM | POA: Diagnosis not present

## 2017-01-14 DIAGNOSIS — Z136 Encounter for screening for cardiovascular disorders: Secondary | ICD-10-CM | POA: Diagnosis not present

## 2017-01-14 DIAGNOSIS — I10 Essential (primary) hypertension: Secondary | ICD-10-CM | POA: Diagnosis not present

## 2017-01-16 ENCOUNTER — Encounter (HOSPITAL_COMMUNITY): Payer: Self-pay | Admitting: *Deleted

## 2017-01-16 ENCOUNTER — Emergency Department (HOSPITAL_COMMUNITY)
Admission: EM | Admit: 2017-01-16 | Discharge: 2017-01-16 | Disposition: A | Discharge status: Home / Self Care | Payer: Medicare Other | Attending: Emergency Medicine | Admitting: Emergency Medicine

## 2017-01-16 ENCOUNTER — Emergency Department (HOSPITAL_COMMUNITY): Payer: Medicare Other

## 2017-01-16 DIAGNOSIS — I10 Essential (primary) hypertension: Secondary | ICD-10-CM | POA: Insufficient documentation

## 2017-01-16 DIAGNOSIS — S42211A Unspecified displaced fracture of surgical neck of right humerus, initial encounter for closed fracture: Secondary | ICD-10-CM | POA: Diagnosis not present

## 2017-01-16 DIAGNOSIS — S0181XA Laceration without foreign body of other part of head, initial encounter: Secondary | ICD-10-CM | POA: Insufficient documentation

## 2017-01-16 DIAGNOSIS — Y999 Unspecified external cause status: Secondary | ICD-10-CM | POA: Diagnosis not present

## 2017-01-16 DIAGNOSIS — Y939 Activity, unspecified: Secondary | ICD-10-CM | POA: Diagnosis not present

## 2017-01-16 DIAGNOSIS — M25511 Pain in right shoulder: Secondary | ICD-10-CM | POA: Diagnosis not present

## 2017-01-16 DIAGNOSIS — W19XXXA Unspecified fall, initial encounter: Secondary | ICD-10-CM

## 2017-01-16 DIAGNOSIS — M25531 Pain in right wrist: Secondary | ICD-10-CM | POA: Diagnosis not present

## 2017-01-16 DIAGNOSIS — S01111A Laceration without foreign body of right eyelid and periocular area, initial encounter: Secondary | ICD-10-CM | POA: Diagnosis not present

## 2017-01-16 DIAGNOSIS — Z79899 Other long term (current) drug therapy: Secondary | ICD-10-CM | POA: Diagnosis not present

## 2017-01-16 DIAGNOSIS — Y92008 Other place in unspecified non-institutional (private) residence as the place of occurrence of the external cause: Secondary | ICD-10-CM | POA: Insufficient documentation

## 2017-01-16 DIAGNOSIS — S42201A Unspecified fracture of upper end of right humerus, initial encounter for closed fracture: Secondary | ICD-10-CM | POA: Diagnosis not present

## 2017-01-16 DIAGNOSIS — W0110XA Fall on same level from slipping, tripping and stumbling with subsequent striking against unspecified object, initial encounter: Secondary | ICD-10-CM | POA: Diagnosis not present

## 2017-01-16 DIAGNOSIS — S4992XA Unspecified injury of left shoulder and upper arm, initial encounter: Secondary | ICD-10-CM | POA: Diagnosis present

## 2017-01-16 HISTORY — DX: Disorder of thyroid, unspecified: E07.9

## 2017-01-16 HISTORY — DX: Essential (primary) hypertension: I10

## 2017-01-16 HISTORY — DX: Gastro-esophageal reflux disease without esophagitis: K21.9

## 2017-01-16 HISTORY — DX: Pure hypercholesterolemia, unspecified: E78.00

## 2017-01-16 MED ORDER — BACITRACIN ZINC 500 UNIT/GM EX OINT
TOPICAL_OINTMENT | CUTANEOUS | Status: AC
  Filled 2017-01-16: qty 0.9

## 2017-01-16 MED ORDER — LIDOCAINE-EPINEPHRINE (PF) 2 %-1:200000 IJ SOLN
10.0000 mL | Freq: Once | INTRAMUSCULAR | Status: AC
  Administered 2017-01-16: 10 mL via INTRADERMAL
  Filled 2017-01-16: qty 20

## 2017-01-16 MED ORDER — HYDROCODONE-ACETAMINOPHEN 5-325 MG PO TABS
1.0000 | ORAL_TABLET | Freq: Once | ORAL | Status: AC
  Administered 2017-01-16: 1 via ORAL
  Filled 2017-01-16: qty 1

## 2017-01-16 MED ORDER — HYDROCODONE-ACETAMINOPHEN 5-325 MG PO TABS: 1.0000 | ORAL_TABLET | Freq: Four times a day (QID) | ORAL | 0 refills | Status: DC | PRN

## 2017-01-16 NOTE — ED Notes (Signed)
Patient transported to X-ray 

## 2017-01-16 NOTE — ED Provider Notes (Signed)
[facility] DEPT Provider Note   CSN: [id number] Arrival date & time: 01/16/17  1156     History   Chief Complaint Chief Complaint  Patient presents with  . Shoulder Pain  . Facial Laceration    HPI Carmen [name] is a 71 y.o. female.  HPI Patient slipped and fell in her laundry room 10:30 AM today striking her left shoulder also suffered laceration at left forehead as result of event. She denies loss causes denies neck pain denies headache. Also complains of mild left wrist pain as result fall. No other associated symptoms. No treatment prior to coming here. Pain is worse with moving her left shoulder. Left Wrist pain is minimal.. She denies syncope or lightheadedness prior to the fall. Past Medical History:  Diagnosis Date  . GERD (gastroesophageal reflux disease)   . Hypercholesterolemia   . Hypertension   . Thyroid disease     Patient Active Problem List   Diagnosis Date Noted  . Dysphagia, unspecified(787.20) 12/15/2012  . Esophageal reflux 12/15/2012    Past Surgical History:  Procedure Laterality Date  . ABDOMINAL HYSTERECTOMY    . CHOLECYSTECTOMY    . ELBOW ARTHROSCOPY    . FINGER SURGERY    . FOOT TENDON SURGERY    . SHOULDER SURGERY      OB History    No data available       Home Medications    Prior to Admission medications   Medication Sig Start Date End Date Taking? Authorizing Provider  amLODipine (NORVASC) 5 MG tablet  11/21/12   [provider]  atorvastatin (LIPITOR) 80 MG tablet  11/22/12   [provider]  Coenzyme Q10 (COQ10) 100 MG CAPS Take by mouth.    [provider]  estradiol (ESTRACE) 1 MG tablet  09/23/12   [provider]  Fish Oil-Cholecalciferol (FISH OIL + D3) 1000-1000 MG-UNIT CAPS Take by mouth.    [provider]  levothyroxine (SYNTHROID, LEVOTHROID) 75 MCG tablet Take 75 mcg by mouth daily before breakfast.    [provider]  losartan (COZAAR) 100 MG tablet  12/12/12    [provider]  Misc Natural Products (GLUCOSAMINE CHONDROITIN ADV PO) Take by mouth.    [provider]  Multiple Vitamins-Minerals (MULTIVITAMIN PO) Take by mouth.    [provider]  omeprazole (PRILOSEC) 20 MG capsule Take 1 capsule (20 mg total) by mouth daily. 01/24/13   Inda Castle, MD  POTASSIUM CHLORIDE PO Take by mouth.    [provider]    Family History Family History  Problem Relation Age of Onset  . Diabetes Father     Social History Social History  Substance Use Topics  . Smoking status: Never Smoker  . Smokeless tobacco: Never Used  . Alcohol use 1.2 oz/week    1 Glasses of wine, 1 Shots of liquor per week     Allergies   Penicillins; Tetanus toxoids; and Adhesive [tape]   Review of Systems Review of Systems  Constitutional: Negative.   HENT: Negative.   Respiratory: Negative.   Cardiovascular: Negative.   Gastrointestinal: Negative.   Musculoskeletal: Positive for arthralgias.  Skin: Positive for wound.       Forehead laceration  Neurological: Negative.   Psychiatric/Behavioral: Negative.   All other systems reviewed and are negative.    Physical Exam Updated Vital Signs BP (!) 143/62 (BP Location: Right Arm)   Pulse 92   Temp 98 F (36.7 C) (Oral)   Resp 18  Ht 5\' 7"  (1.702 m)   Wt 88.5 kg (195 lb)   SpO2 96%   BMI 30.54 kg/m   Physical Exam  Constitutional: She is oriented to person, place, and time. She appears well-developed and well-nourished. No distress.  HENT:  5 cm flap Laceration atrightforehead.  above and overlying eyebrow No soft tissue swelling. Otherwise normocephalic atraumatic  Eyes: Pupils are equal, round, and reactive to light. Conjunctivae are normal.  Neck: Neck supple. No tracheal deviation present. No thyromegaly present.  Cardiovascular: Normal rate and regular rhythm.   No murmur heard. Pulmonary/Chest: Effort normal and breath sounds normal.  Abdominal: Soft. Bowel  sounds are normal. She exhibits no distension. There is no tenderness.  Musculoskeletal: Normal range of motion. She exhibits no edema or tenderness.  rightupper extremity tender overlying shoulder. No tenderness to clavicle or before meals joint. No deformity. Limited range of motion of shoulder secondary to pain. Wrist is nontender. No anatomic snuffbox tenderness or swelling or deformity. Good capillary refill. Radial pulse 2+. All other extremities without contusion abrasion or tenderness neurovascularly intact  Neurological: She is alert and oriented to person, place, and time. She displays normal reflexes. Coordination normal.  Gait normal  Skin: Skin is warm and dry. No rash noted.  Psychiatric: She has a normal mood and affect.  Nursing note and vitals reviewed.    ED Treatments / Results  Labs (all labs ordered are listed, but only abnormal results are displayed) Labs Reviewed - No data to display  EKG  EKG Interpretation None       Radiology No results found.  Procedures .Marland KitchenLaceration Repair Date/Time: 01/16/2017 5:06 PM Performed by: Orlie Dakin Authorized by: Orlie Dakin   Consent:    Consent obtained:  Verbal   Consent given by:  Patient   Risks discussed:  Poor wound healing and need for additional repair   Alternatives discussed:  Observation and delayed treatment Anesthesia (see MAR for exact dosages):    Anesthesia method:  Local infiltration   Local anesthetic:  Lidocaine 2% WITH epi Laceration details:    Location:  Face   Face location:  R eyebrow   Length (cm):  5   Depth (mm):  2 Repair type:    Repair type:  Simple Pre-procedure details:    Preparation:  Patient was prepped and draped in usual sterile fashion Exploration:    Wound exploration: entire depth of wound probed and visualized     Wound extent: underlying fracture     Wound extent: no foreign bodies/material noted, no muscle damage noted and no nerve damage noted      Contaminated: no   Treatment:    Area cleansed with:  Betadine   Amount of cleaning:  Standard   Irrigation solution:  Sterile saline   Irrigation method:  Syringe   Visualized foreign bodies/material removed: no   Skin repair:    Repair method:  Sutures   Suture size:  5-0   Suture material:  Prolene   Suture technique:  Simple interrupted Approximation:    Approximation:  Close Post-procedure details:    Dressing:  Antibiotic ointment and sterile dressing   Patient tolerance of procedure:  Tolerated well, no immediate complications   (including critical care time)  Medications Ordered in ED Medications  HYDROcodone-acetaminophen (NORCO/VICODIN) 5-325 MG per tablet 1 tablet (not administered)   X-rays viewed by me No results found for this or any previous visit. Dg Shoulder Right  Result Date: 01/16/2017 CLINICAL DATA:  Right shoulder  pain after falling this evening. EXAM: RIGHT SHOULDER - 2+ VIEW COMPARISON:  11/13/2008 FINDINGS: No external rotation view is provided, and I suspect that the patient is probably unable to assume the external rotation view. There is cortical discontinuity posteriorly along the surgical neck of the humerus favoring a surgical neck fracture. This is only seen on the internal rotation view. The transscapular view is blurred but indicates normal glenohumeral alignment. AC joint intact. Left basilar bandlike density favoring plate like atelectasis. IMPRESSION: 1. Relatively nondisplaced surgical neck fracture of the right proximal humerus. This is poorly characterized as the patient was unable to assume a normal external rotation view. 2. Subsegmental atelectasis at the right lung base. Electronically Signed   By: Van Clines M.D.   On: 01/16/2017 16:00   Initial Impression / Assessment and Plan / ED Course  I have reviewed the triage vital signs and the nursing notes.  Pertinent labs & imaging results that were available during my care of the  patient were reviewed by me and considered in my medical decision making (see chart for details).   5:30 PM patient is more comfortable in sling and pain is improved after treatment with Norco  Plan sutures out in 5 days. Prescription [place] Controlled Substance reporting System queried [facility]. Referral Dr. Doran Durand for orthopedics whom she has seen in the past.  Final Clinical Impressions(s) / ED Diagnoses  Diagnosis #1 fall #2 closed fracture of right proximal humerus #3  Five cm forehead laceration  Final diagnoses:  None    New Prescriptions New Prescriptions   No medications on file     Orlie Dakin, MD 01/16/17 1736

## 2017-01-16 NOTE — Discharge Instructions (Signed)
Call Dr. Nona Dell office in 2 days to arrange for next available appointment. Wear the sling as needed for comfort. Take Tylenol for mild pain or the pain medicine prescribed for bad pain. Don't take Tylenol together with the pain medicine prescribed as the combination can be dangerous to your liver. Sutures to come out in 5 days. The sutures can be taken out at an urgent care center at or that your primary care physician's office. You can wash the wound on your forehead gently each day with soap and water and place a thin layer of bacitracin ointment over the wound. Signs of infection including redness around the wound, drainage from the wound, more pain, or fever. If you think you may be developing an infection you should be seen sooner by a doctor than 5 days from now. Return if concern for any reason

## 2017-01-16 NOTE — ED Triage Notes (Signed)
Patient is alert and oriented x4.  She is being seen post fall in her laundry room at home.  Patient denies any dizziness or LOC.  Currently she rates her pain 8 of 10 in her left shoulder.

## 2017-01-16 NOTE — ED Notes (Signed)
MD at bedside. 

## 2017-01-19 DIAGNOSIS — S42291A Other displaced fracture of upper end of right humerus, initial encounter for closed fracture: Secondary | ICD-10-CM | POA: Diagnosis not present

## 2017-01-21 DIAGNOSIS — Z4802 Encounter for removal of sutures: Secondary | ICD-10-CM | POA: Diagnosis not present

## 2017-01-24 DIAGNOSIS — S42221D 2-part displaced fracture of surgical neck of right humerus, subsequent encounter for fracture with routine healing: Secondary | ICD-10-CM | POA: Diagnosis not present

## 2017-01-26 ENCOUNTER — Encounter (HOSPITAL_COMMUNITY): Payer: Self-pay | Admitting: *Deleted

## 2017-01-27 ENCOUNTER — Encounter (HOSPITAL_COMMUNITY): Payer: Self-pay | Admitting: *Deleted

## 2017-01-27 ENCOUNTER — Ambulatory Visit (HOSPITAL_COMMUNITY): Payer: Medicare Other | Admitting: Anesthesiology

## 2017-01-27 ENCOUNTER — Observation Stay (HOSPITAL_COMMUNITY)
Admission: RE | Admit: 2017-01-27 | Discharge: 2017-01-28 | Disposition: A | Discharge status: Home / Self Care | Payer: Medicare Other | Source: Ambulatory Visit | Attending: Orthopedic Surgery | Admitting: Orthopedic Surgery

## 2017-01-27 ENCOUNTER — Encounter (HOSPITAL_COMMUNITY)
Admission: RE | Disposition: A | Discharge status: Home / Self Care | Payer: Self-pay | Source: Ambulatory Visit | Attending: Orthopedic Surgery

## 2017-01-27 ENCOUNTER — Ambulatory Visit (HOSPITAL_COMMUNITY): Payer: Medicare Other

## 2017-01-27 DIAGNOSIS — S42291A Other displaced fracture of upper end of right humerus, initial encounter for closed fracture: Principal | ICD-10-CM | POA: Insufficient documentation

## 2017-01-27 DIAGNOSIS — E7800 Pure hypercholesterolemia, unspecified: Secondary | ICD-10-CM | POA: Diagnosis not present

## 2017-01-27 DIAGNOSIS — Z91048 Other nonmedicinal substance allergy status: Secondary | ICD-10-CM | POA: Insufficient documentation

## 2017-01-27 DIAGNOSIS — M79621 Pain in right upper arm: Secondary | ICD-10-CM | POA: Diagnosis not present

## 2017-01-27 DIAGNOSIS — W19XXXA Unspecified fall, initial encounter: Secondary | ICD-10-CM | POA: Insufficient documentation

## 2017-01-27 DIAGNOSIS — G8918 Other acute postprocedural pain: Secondary | ICD-10-CM | POA: Diagnosis not present

## 2017-01-27 DIAGNOSIS — E079 Disorder of thyroid, unspecified: Secondary | ICD-10-CM | POA: Diagnosis not present

## 2017-01-27 DIAGNOSIS — Z419 Encounter for procedure for purposes other than remedying health state, unspecified: Secondary | ICD-10-CM

## 2017-01-27 DIAGNOSIS — I10 Essential (primary) hypertension: Secondary | ICD-10-CM | POA: Insufficient documentation

## 2017-01-27 DIAGNOSIS — Z79899 Other long term (current) drug therapy: Secondary | ICD-10-CM | POA: Insufficient documentation

## 2017-01-27 DIAGNOSIS — S42201A Unspecified fracture of upper end of right humerus, initial encounter for closed fracture: Secondary | ICD-10-CM | POA: Diagnosis not present

## 2017-01-27 DIAGNOSIS — Z880 Allergy status to penicillin: Secondary | ICD-10-CM | POA: Insufficient documentation

## 2017-01-27 DIAGNOSIS — Z888 Allergy status to other drugs, medicaments and biological substances status: Secondary | ICD-10-CM | POA: Diagnosis not present

## 2017-01-27 DIAGNOSIS — S42221A 2-part displaced fracture of surgical neck of right humerus, initial encounter for closed fracture: Secondary | ICD-10-CM | POA: Diagnosis not present

## 2017-01-27 DIAGNOSIS — R1310 Dysphagia, unspecified: Secondary | ICD-10-CM | POA: Diagnosis not present

## 2017-01-27 DIAGNOSIS — K219 Gastro-esophageal reflux disease without esophagitis: Secondary | ICD-10-CM | POA: Diagnosis not present

## 2017-01-27 DIAGNOSIS — S42209A Unspecified fracture of upper end of unspecified humerus, initial encounter for closed fracture: Secondary | ICD-10-CM | POA: Diagnosis present

## 2017-01-27 HISTORY — PX: ORIF HUMERUS FRACTURE: SHX2126

## 2017-01-27 LAB — CBC
HCT: 44.1 % (ref 36.0–46.0)
Hemoglobin: 14.5 g/dL (ref 12.0–15.0)
MCH: 28.7 pg (ref 26.0–34.0)
MCHC: 32.9 g/dL (ref 30.0–36.0)
MCV: 87.3 fL (ref 78.0–100.0)
Platelets: 374 10*3/uL (ref 150–400)
RBC: 5.05 MIL/uL (ref 3.87–5.11)
RDW: 12.9 % (ref 11.5–15.5)
WBC: 9.4 10*3/uL (ref 4.0–10.5)

## 2017-01-27 LAB — BASIC METABOLIC PANEL
Anion gap: 12 (ref 5–15)
BUN: 21 mg/dL — ABNORMAL HIGH (ref 6–20)
CALCIUM: 9.9 mg/dL (ref 8.9–10.3)
CHLORIDE: 103 mmol/L (ref 101–111)
CO2: 21 mmol/L — AB (ref 22–32)
CREATININE: 0.91 mg/dL (ref 0.44–1.00)
GFR calc Af Amer: >60 mL/min (ref >60)
GFR calc non Af Amer: >60 mL/min (ref >60)
Glucose, Bld: 124 mg/dL — ABNORMAL HIGH (ref 65–99)
Potassium: 4.4 mmol/L (ref 3.5–5.1)
SODIUM: 136 mmol/L (ref 135–145)

## 2017-01-27 SURGERY — OPEN REDUCTION INTERNAL FIXATION (ORIF) PROXIMAL HUMERUS FRACTURE
Anesthesia: General | Laterality: Right

## 2017-01-27 MED ORDER — PHENYLEPHRINE 40 MCG/ML (10ML) SYRINGE FOR IV PUSH (FOR BLOOD PRESSURE SUPPORT)
PREFILLED_SYRINGE | INTRAVENOUS | Status: AC
  Filled 2017-01-27: qty 10

## 2017-01-27 MED ORDER — ARTIFICIAL TEARS OPHTHALMIC OINT
TOPICAL_OINTMENT | OPHTHALMIC | Status: AC
  Filled 2017-01-27: qty 3.5

## 2017-01-27 MED ORDER — PHENYLEPHRINE HCL 10 MG/ML IJ SOLN
INTRAVENOUS | Status: DC | PRN
  Administered 2017-01-27: 25 ug/min via INTRAVENOUS

## 2017-01-27 MED ORDER — HYDROMORPHONE HCL 1 MG/ML IJ SOLN: 1.0000 mg | INTRAMUSCULAR | Status: DC | PRN

## 2017-01-27 MED ORDER — EPHEDRINE 5 MG/ML INJ
INTRAVENOUS | Status: AC
  Filled 2017-01-27: qty 10

## 2017-01-27 MED ORDER — FENTANYL CITRATE (PF) 100 MCG/2ML IJ SOLN
INTRAMUSCULAR | Status: DC | PRN
  Administered 2017-01-27: 100 ug via INTRAVENOUS
  Administered 2017-01-27 (×3): 50 ug via INTRAVENOUS

## 2017-01-27 MED ORDER — DIAZEPAM 5 MG PO TABS: 5.0000 mg | ORAL_TABLET | Freq: Four times a day (QID) | ORAL | Status: DC | PRN

## 2017-01-27 MED ORDER — ROPIVACAINE HCL 5 MG/ML IJ SOLN
INTRAMUSCULAR | Status: DC | PRN
  Administered 2017-01-27: 30 mL via PERINEURAL

## 2017-01-27 MED ORDER — BISACODYL 5 MG PO TBEC: 5.0000 mg | DELAYED_RELEASE_TABLET | Freq: Every day | ORAL | Status: DC | PRN

## 2017-01-27 MED ORDER — LIDOCAINE 2% (20 MG/ML) 5 ML SYRINGE
INTRAMUSCULAR | Status: AC
  Filled 2017-01-27: qty 10

## 2017-01-27 MED ORDER — FENTANYL CITRATE (PF) 100 MCG/2ML IJ SOLN
INTRAMUSCULAR | Status: AC
  Administered 2017-01-27: 50 ug via INTRAVENOUS
  Filled 2017-01-27: qty 2

## 2017-01-27 MED ORDER — ONDANSETRON HCL 4 MG/2ML IJ SOLN
INTRAMUSCULAR | Status: DC | PRN
  Administered 2017-01-27: 4 mg via INTRAVENOUS

## 2017-01-27 MED ORDER — NEOSTIGMINE METHYLSULFATE 5 MG/5ML IV SOSY
PREFILLED_SYRINGE | INTRAVENOUS | Status: DC | PRN
  Administered 2017-01-27: 5 mg via INTRAVENOUS

## 2017-01-27 MED ORDER — PHENYLEPHRINE HCL 10 MG/ML IJ SOLN
INTRAMUSCULAR | Status: DC | PRN
  Administered 2017-01-27: 80 ug via INTRAVENOUS

## 2017-01-27 MED ORDER — LACTATED RINGERS IV SOLN
INTRAVENOUS | Status: DC
  Administered 2017-01-27 (×3): via INTRAVENOUS

## 2017-01-27 MED ORDER — ACETAMINOPHEN 325 MG PO TABS: 650.0000 mg | ORAL_TABLET | Freq: Four times a day (QID) | ORAL | Status: DC | PRN

## 2017-01-27 MED ORDER — DEXAMETHASONE SODIUM PHOSPHATE 10 MG/ML IJ SOLN
INTRAMUSCULAR | Status: AC
  Filled 2017-01-27: qty 2

## 2017-01-27 MED ORDER — LIDOCAINE 2% (20 MG/ML) 5 ML SYRINGE
INTRAMUSCULAR | Status: DC | PRN
  Administered 2017-01-27: 80 mg via INTRAVENOUS

## 2017-01-27 MED ORDER — PROPOFOL 10 MG/ML IV BOLUS
INTRAVENOUS | Status: AC
  Filled 2017-01-27: qty 20

## 2017-01-27 MED ORDER — PANTOPRAZOLE SODIUM 40 MG PO TBEC
40.0000 mg | DELAYED_RELEASE_TABLET | Freq: Every day | ORAL | Status: DC
  Administered 2017-01-28: 40 mg via ORAL
  Filled 2017-01-27: qty 1

## 2017-01-27 MED ORDER — ROCURONIUM BROMIDE 10 MG/ML (PF) SYRINGE
PREFILLED_SYRINGE | INTRAVENOUS | Status: DC | PRN
  Administered 2017-01-27: 60 mg via INTRAVENOUS

## 2017-01-27 MED ORDER — FENTANYL CITRATE (PF) 100 MCG/2ML IJ SOLN
50.0000 ug | Freq: Once | INTRAMUSCULAR | Status: AC
  Administered 2017-01-27: 50 ug via INTRAVENOUS

## 2017-01-27 MED ORDER — AMLODIPINE BESYLATE 5 MG PO TABS
5.0000 mg | ORAL_TABLET | Freq: Every day | ORAL | Status: DC
  Administered 2017-01-28: 5 mg via ORAL
  Filled 2017-01-27: qty 1

## 2017-01-27 MED ORDER — PHENOL 1.4 % MT LIQD: 1.0000 | OROMUCOSAL | Status: DC | PRN

## 2017-01-27 MED ORDER — OXYCODONE HCL 5 MG PO TABS
5.0000 mg | ORAL_TABLET | ORAL | Status: DC | PRN
  Administered 2017-01-28 (×3): 5 mg via ORAL
  Filled 2017-01-27 (×3): qty 1

## 2017-01-27 MED ORDER — NEOSTIGMINE METHYLSULFATE 5 MG/5ML IV SOSY
PREFILLED_SYRINGE | INTRAVENOUS | Status: AC
  Filled 2017-01-27: qty 10

## 2017-01-27 MED ORDER — DIPHENHYDRAMINE HCL 12.5 MG/5ML PO ELIX: 12.5000 mg | ORAL_SOLUTION | ORAL | Status: DC | PRN

## 2017-01-27 MED ORDER — PROMETHAZINE HCL 25 MG/ML IJ SOLN: 6.2500 mg | INTRAMUSCULAR | Status: DC | PRN

## 2017-01-27 MED ORDER — GLYCOPYRROLATE 0.2 MG/ML IV SOSY
PREFILLED_SYRINGE | INTRAVENOUS | Status: DC | PRN
  Administered 2017-01-27: 0.8 mg via INTRAVENOUS

## 2017-01-27 MED ORDER — ROCURONIUM BROMIDE 10 MG/ML (PF) SYRINGE
PREFILLED_SYRINGE | INTRAVENOUS | Status: AC
  Filled 2017-01-27: qty 10

## 2017-01-27 MED ORDER — POLYETHYLENE GLYCOL 3350 17 G PO PACK: 17.0000 g | PACK | Freq: Every day | ORAL | Status: DC | PRN

## 2017-01-27 MED ORDER — MAGNESIUM CITRATE PO SOLN: 1.0000 | Freq: Once | ORAL | Status: DC | PRN

## 2017-01-27 MED ORDER — MENTHOL 3 MG MT LOZG: 1.0000 | LOZENGE | OROMUCOSAL | Status: DC | PRN

## 2017-01-27 MED ORDER — CEFAZOLIN SODIUM-DEXTROSE 2-4 GM/100ML-% IV SOLN
INTRAVENOUS | Status: AC
  Filled 2017-01-27: qty 100

## 2017-01-27 MED ORDER — LEVOTHYROXINE SODIUM 75 MCG PO TABS
75.0000 ug | ORAL_TABLET | Freq: Every day | ORAL | Status: DC
  Administered 2017-01-28: 75 ug via ORAL
  Filled 2017-01-27: qty 1

## 2017-01-27 MED ORDER — MIDAZOLAM HCL 2 MG/2ML IJ SOLN
INTRAMUSCULAR | Status: AC
  Administered 2017-01-27: 2 mg via INTRAVENOUS
  Filled 2017-01-27: qty 2

## 2017-01-27 MED ORDER — METOCLOPRAMIDE HCL 5 MG/ML IJ SOLN: 5.0000 mg | Freq: Three times a day (TID) | INTRAMUSCULAR | Status: DC | PRN

## 2017-01-27 MED ORDER — ONDANSETRON HCL 4 MG PO TABS: 4.0000 mg | ORAL_TABLET | Freq: Four times a day (QID) | ORAL | Status: DC | PRN

## 2017-01-27 MED ORDER — CHLORHEXIDINE GLUCONATE 4 % EX LIQD: 60.0000 mL | Freq: Once | CUTANEOUS | Status: DC

## 2017-01-27 MED ORDER — LACTATED RINGERS IV SOLN
INTRAVENOUS | Status: DC
  Administered 2017-01-27: 19:00:00 via INTRAVENOUS

## 2017-01-27 MED ORDER — DOCUSATE SODIUM 100 MG PO CAPS
100.0000 mg | ORAL_CAPSULE | Freq: Two times a day (BID) | ORAL | Status: DC
  Administered 2017-01-27 – 2017-01-28 (×2): 100 mg via ORAL
  Filled 2017-01-27 (×2): qty 1

## 2017-01-27 MED ORDER — DEXAMETHASONE SODIUM PHOSPHATE 10 MG/ML IJ SOLN
INTRAMUSCULAR | Status: DC | PRN
  Administered 2017-01-27: 10 mg via INTRAVENOUS

## 2017-01-27 MED ORDER — LIDOCAINE 2% (20 MG/ML) 5 ML SYRINGE
INTRAMUSCULAR | Status: AC
  Filled 2017-01-27: qty 5

## 2017-01-27 MED ORDER — CEFAZOLIN SODIUM-DEXTROSE 2-4 GM/100ML-% IV SOLN
2.0000 g | INTRAVENOUS | Status: AC
  Administered 2017-01-27: 2 g via INTRAVENOUS

## 2017-01-27 MED ORDER — ONDANSETRON HCL 4 MG/2ML IJ SOLN
INTRAMUSCULAR | Status: AC
  Filled 2017-01-27: qty 4

## 2017-01-27 MED ORDER — MIDAZOLAM HCL 2 MG/2ML IJ SOLN
INTRAMUSCULAR | Status: AC
  Filled 2017-01-27: qty 2

## 2017-01-27 MED ORDER — PANTOPRAZOLE SODIUM 40 MG PO TBEC: 40.0000 mg | DELAYED_RELEASE_TABLET | Freq: Every day | ORAL | Status: DC

## 2017-01-27 MED ORDER — DEXAMETHASONE SODIUM PHOSPHATE 10 MG/ML IJ SOLN
INTRAMUSCULAR | Status: AC
  Filled 2017-01-27: qty 1

## 2017-01-27 MED ORDER — ALUM & MAG HYDROXIDE-SIMETH 200-200-20 MG/5ML PO SUSP: 30.0000 mL | ORAL | Status: DC | PRN

## 2017-01-27 MED ORDER — MEPERIDINE HCL 25 MG/ML IJ SOLN: 6.2500 mg | INTRAMUSCULAR | Status: DC | PRN

## 2017-01-27 MED ORDER — ARTIFICIAL TEARS OPHTHALMIC OINT
TOPICAL_OINTMENT | OPHTHALMIC | Status: DC | PRN
  Administered 2017-01-27: 1 via OPHTHALMIC

## 2017-01-27 MED ORDER — HYDROMORPHONE HCL 1 MG/ML IJ SOLN: 0.2500 mg | INTRAMUSCULAR | Status: DC | PRN

## 2017-01-27 MED ORDER — ACETAMINOPHEN 650 MG RE SUPP: 650.0000 mg | Freq: Four times a day (QID) | RECTAL | Status: DC | PRN

## 2017-01-27 MED ORDER — MIDAZOLAM HCL 2 MG/2ML IJ SOLN
2.0000 mg | Freq: Once | INTRAMUSCULAR | Status: AC
  Administered 2017-01-27: 2 mg via INTRAVENOUS

## 2017-01-27 MED ORDER — CEFAZOLIN SODIUM-DEXTROSE 2-4 GM/100ML-% IV SOLN
2.0000 g | Freq: Four times a day (QID) | INTRAVENOUS | Status: AC
  Administered 2017-01-28 (×3): 2 g via INTRAVENOUS
  Filled 2017-01-27 (×4): qty 100

## 2017-01-27 MED ORDER — LOSARTAN POTASSIUM 50 MG PO TABS
100.0000 mg | ORAL_TABLET | Freq: Every day | ORAL | Status: DC
  Administered 2017-01-27 – 2017-01-28 (×2): 100 mg via ORAL
  Filled 2017-01-27 (×2): qty 2

## 2017-01-27 MED ORDER — PHENYLEPHRINE 40 MCG/ML (10ML) SYRINGE FOR IV PUSH (FOR BLOOD PRESSURE SUPPORT)
PREFILLED_SYRINGE | INTRAVENOUS | Status: AC
  Filled 2017-01-27: qty 20

## 2017-01-27 MED ORDER — ONDANSETRON HCL 4 MG/2ML IJ SOLN: 4.0000 mg | Freq: Four times a day (QID) | INTRAMUSCULAR | Status: DC | PRN

## 2017-01-27 MED ORDER — FENTANYL CITRATE (PF) 100 MCG/2ML IJ SOLN
INTRAMUSCULAR | Status: AC
  Filled 2017-01-27: qty 2

## 2017-01-27 MED ORDER — METOCLOPRAMIDE HCL 5 MG PO TABS: 5.0000 mg | ORAL_TABLET | Freq: Three times a day (TID) | ORAL | Status: DC | PRN

## 2017-01-27 MED ORDER — LACTATED RINGERS IV SOLN: INTRAVENOUS | Status: DC

## 2017-01-27 MED ORDER — PROPOFOL 10 MG/ML IV BOLUS
INTRAVENOUS | Status: DC | PRN
  Administered 2017-01-27: 100 mg via INTRAVENOUS

## 2017-01-27 MED ORDER — ROCURONIUM BROMIDE 10 MG/ML (PF) SYRINGE
PREFILLED_SYRINGE | INTRAVENOUS | Status: AC
  Filled 2017-01-27: qty 5

## 2017-01-27 MED ORDER — ETOMIDATE 2 MG/ML IV SOLN
INTRAVENOUS | Status: AC
  Filled 2017-01-27: qty 10

## 2017-01-27 MED ORDER — SODIUM CHLORIDE 0.9 % IR SOLN
Status: DC | PRN
  Administered 2017-01-27: 1000 mL

## 2017-01-27 MED ORDER — ONDANSETRON HCL 4 MG/2ML IJ SOLN
INTRAMUSCULAR | Status: AC
  Filled 2017-01-27: qty 2

## 2017-01-27 MED ORDER — FENTANYL CITRATE (PF) 250 MCG/5ML IJ SOLN
INTRAMUSCULAR | Status: AC
  Filled 2017-01-27: qty 5

## 2017-01-27 MED ORDER — NEOSTIGMINE METHYLSULFATE 5 MG/5ML IV SOSY
PREFILLED_SYRINGE | INTRAVENOUS | Status: AC
  Filled 2017-01-27: qty 5

## 2017-01-27 SURGICAL SUPPLY — 61 items
BIT DRILL 3.2 (BIT) ×2
BIT DRILL 3.2XCALB NS DISP (BIT) ×1 IMPLANT
BIT DRILL CALIBRATED 2.7 (BIT) ×2 IMPLANT
BIT DRILL CALIBRATED 2.7MM (BIT) ×1
BIT DRL 3.2XCALB NS DISP (BIT) ×1
COVER SURGICAL LIGHT HANDLE (MISCELLANEOUS) ×3 IMPLANT
DRAPE C-ARM 42X72 X-RAY (DRAPES) ×3 IMPLANT
DRAPE INCISE IOBAN 66X45 STRL (DRAPES) IMPLANT
DRAPE ORTHO SPLIT 77X108 STRL (DRAPES) ×4
DRAPE SURG ORHT 6 SPLT 77X108 (DRAPES) ×2 IMPLANT
DRAPE U-SHAPE 47X51 STRL (DRAPES) ×3 IMPLANT
DRESSING AQUACEL AQ EXTRA 4X5 (GAUZE/BANDAGES/DRESSINGS) ×3 IMPLANT
DRSG AQUACEL AG ADV 3.5X10 (GAUZE/BANDAGES/DRESSINGS) ×3 IMPLANT
DURAPREP 26ML APPLICATOR (WOUND CARE) ×3 IMPLANT
ELECT REM PT RETURN 9FT ADLT (ELECTROSURGICAL) ×3
ELECTRODE REM PT RTRN 9FT ADLT (ELECTROSURGICAL) ×1 IMPLANT
GLOVE BIO SURGEON STRL SZ7.5 (GLOVE) ×3 IMPLANT
GLOVE BIO SURGEON STRL SZ8 (GLOVE) ×3 IMPLANT
GLOVE EUDERMIC 7 POWDERFREE (GLOVE) ×6 IMPLANT
GLOVE SS BIOGEL STRL SZ 7.5 (GLOVE) ×2 IMPLANT
GLOVE SUPERSENSE BIOGEL SZ 7.5 (GLOVE) ×4
GOWN STRL REUS W/ TWL LRG LVL3 (GOWN DISPOSABLE) ×1 IMPLANT
GOWN STRL REUS W/ TWL XL LVL3 (GOWN DISPOSABLE) ×2 IMPLANT
GOWN STRL REUS W/TWL LRG LVL3 (GOWN DISPOSABLE) ×2
GOWN STRL REUS W/TWL XL LVL3 (GOWN DISPOSABLE) ×4
K-WIRE 2X5 SS THRDED S3 (WIRE) ×3
KIT BASIN OR (CUSTOM PROCEDURE TRAY) ×3 IMPLANT
KIT ROOM TURNOVER OR (KITS) ×6 IMPLANT
KWIRE 2X5 SS THRDED S3 (WIRE) ×1 IMPLANT
MANIFOLD NEPTUNE II (INSTRUMENTS) ×3 IMPLANT
NDL SUT .5 MAYO 1.404X.05X (NEEDLE) IMPLANT
NEEDLE 22X1 1/2 (OR ONLY) (NEEDLE) IMPLANT
NEEDLE MAYO TAPER (NEEDLE)
NS IRRIG 1000ML POUR BTL (IV SOLUTION) ×3 IMPLANT
PACK SHOULDER (CUSTOM PROCEDURE TRAY) ×3 IMPLANT
PAD ARMBOARD 7.5X6 YLW CONV (MISCELLANEOUS) ×6 IMPLANT
PAD ORTHO SHOULDER 7X19 LRG (SOFTGOODS) ×3 IMPLANT
PEG LOCKING 3.2MMX44 (Peg) ×3 IMPLANT
PEG LOCKING 3.2X34 (Screw) ×6 IMPLANT
PEG LOCKING 3.2X36 (Screw) ×9 IMPLANT
PEG LOCKING 3.2X38 (Screw) ×3 IMPLANT
PEG LOCKING 3.2X48 (Peg) ×6 IMPLANT
PLATE PROX HUM HI R 3H 80 (Plate) ×3 IMPLANT
RESTRAINT HEAD UNIVERSAL NS (MISCELLANEOUS) ×3 IMPLANT
SCREW LOCK CORT STAR 3.5X26 (Screw) ×6 IMPLANT
SCREW LP NL T15 3.5X24 (Screw) ×3 IMPLANT
SLEEVE MEASURING 3.2 (BIT) ×3 IMPLANT
SPONGE LAP 18X18 X RAY DECT (DISPOSABLE) ×3 IMPLANT
SUCTION FRAZIER HANDLE 10FR (MISCELLANEOUS) ×2
SUCTION TUBE FRAZIER 10FR DISP (MISCELLANEOUS) ×1 IMPLANT
SUT FIBERWIRE #2 38 T-5 BLUE (SUTURE)
SUT MNCRL AB 3-0 PS2 18 (SUTURE) ×3 IMPLANT
SUT VIC AB 1 CT1 27 (SUTURE) ×2
SUT VIC AB 1 CT1 27XBRD ANTBC (SUTURE) ×1 IMPLANT
SUT VIC AB 2-0 CT1 27 (SUTURE) ×4
SUT VIC AB 2-0 CT1 TAPERPNT 27 (SUTURE) ×2 IMPLANT
SUTURE FIBERWR #2 38 T-5 BLUE (SUTURE) IMPLANT
SYR CONTROL 10ML LL (SYRINGE) IMPLANT
TOWEL OR 17X24 6PK STRL BLUE (TOWEL DISPOSABLE) IMPLANT
TOWEL OR 17X26 10 PK STRL BLUE (TOWEL DISPOSABLE) ×3 IMPLANT
WATER STERILE IRR 1000ML POUR (IV SOLUTION) IMPLANT

## 2017-01-27 NOTE — Anesthesia Postprocedure Evaluation (Signed)
Anesthesia Post Note  Patient: Mozetta Murfin  Procedure(s) Performed: Procedure(s) (LRB): OPEN REDUCTION INTERNAL FIXATION (ORIF) PROXIMAL HUMERUS FRACTURE (Right)     Patient location during evaluation: PACU Anesthesia Type: General Level of consciousness: sedated Pain management: pain level controlled Vital Signs Assessment: post-procedure vital signs reviewed and stable Respiratory status: spontaneous breathing and respiratory function stable Cardiovascular status: stable Postop Assessment: no apparent nausea or vomiting Anesthetic complications: no    Last Vitals:  Vitals:   01/27/17 1832 01/27/17 2027  BP: 140/65 128/71  Pulse: 79 89  Resp:  16  Temp: 36.4 C (!) 36.4 C  SpO2: 95% 96%    Last Pain:  Vitals:   01/27/17 2027  TempSrc: Oral  PainSc:                  Keyara Ent DANIEL

## 2017-01-27 NOTE — Discharge Instructions (Signed)
° °  Metta Clines. Supple, M.D., F.A.A.O.S. Orthopaedic Surgery Specializing in Arthroscopic and Reconstructive Surgery of the Shoulder and Knee [phone] [address] [place], [place] [address] - Fax [phone]   POST-OP  SHOULDER  INSTRUCTIONS  1. Call the office at [phone] to schedule your first post-op appointment 10-14 days from the date of your surgery.  2. The bandage over your incision is waterproof. You may begin showering with this dressing on. You may leave this dressing on until first follow up appointment within 2 weeks. We prefer you leave this dressing in place until follow up however after 5-7 days if you are having itching or skin irritation and would like to remove it you may do so. Go slow and tug at the borders gently to break the bond the dressing has with the skin. At this point if there is no drainage it is okay to go without a bandage or you may cover it with a light guaze and tape. You can also expect significant bruising around your shoulder that will drift down your arm and into your chest wall. This is very normal and should resolve over several days.   3. Wear your sling/immobilizer at all times except to perform the exercises below or to occasionally let your arm dangle by your side to stretch your elbow. You also need to sleep in your sling immobilizer until instructed otherwise.  4. Range of motion to your elbow, wrist, and hand are encouraged 3-5 times daily. Exercise to your hand and fingers helps to reduce swelling you may experience.  5. Utilize ice to the shoulder 3-5 times minimum a day and additionally if you are experiencing pain.  6. Prescriptions for a pain medication and a muscle relaxant are provided for you. It is recommended that if you are experiencing pain that you pain medication alone is not controlling, add the muscle relaxant along with the pain medication which can give additional pain relief. The first 1-2 days is generally  the most severe of your pain and then should gradually decrease. As your pain lessens it is recommended that you decrease your use of the pain medications to an "as needed basis'" only and to always comply with the recommended dosages of the pain medications.  7. Pain medications can produce constipation along with their use. If you experience this, the use of an over the counter stool softener or laxative daily is recommended.     8. For additional questions or concerns, please do not hesitate to call the office. If after hours there is an answering service to forward your concerns to the physician on call.  POST-OP EXERCISES  Pendulum Exercises  Perform pendulum exercises while standing and bending at the waist. Support your uninvolved arm on a table or chair and allow your operated arm to hang freely. Make sure to do these exercises passively - not using you shoulder muscles.  Repeat 20 times. Do 3 sessions per day. VERY GENTLY

## 2017-01-27 NOTE — Progress Notes (Signed)
Unable to tolerate clip and prep due to pain

## 2017-01-27 NOTE — Anesthesia Preprocedure Evaluation (Addendum)
Anesthesia Evaluation  Patient identified by MRN, date of birth, ID band Patient awake    Reviewed: Allergy & Precautions, NPO status , Patient's Chart, lab work & pertinent test results  Airway Mallampati: II  TM Distance: >3 FB Neck ROM: Full    Dental  (+) Teeth Intact, Dental Advisory Given   Pulmonary neg pulmonary ROS,    breath sounds clear to auscultation       Cardiovascular hypertension, Pt. on medications  Rhythm:Regular Rate:Tachycardia     Neuro/Psych negative neurological ROS     GI/Hepatic Neg liver ROS, GERD  Medicated,  Endo/Other  negative endocrine ROS  Renal/GU negative Renal ROS     Musculoskeletal negative musculoskeletal ROS (+)   Abdominal   Peds  Hematology negative hematology ROS (+)   Anesthesia Other Findings HLD  Reproductive/Obstetrics                           Anesthesia Physical Anesthesia Plan  ASA: II  Anesthesia Plan: General   Post-op Pain Management:  Regional for Post-op pain   Induction: Intravenous  PONV Risk Score and Plan: 4 or greater and Ondansetron, Dexamethasone, Midazolam and Treatment may vary due to age or medical condition  Airway Management Planned: Oral ETT  Additional Equipment:   Intra-op Plan:   Post-operative Plan: Extubation in OR  Informed Consent: I have reviewed the patients History and Physical, chart, labs and discussed the procedure including the risks, benefits and alternatives for the proposed anesthesia with the patient or authorized representative who has indicated his/her understanding and acceptance.   Dental advisory given  Plan Discussed with: CRNA  Anesthesia Plan Comments:         Anesthesia Quick Evaluation

## 2017-01-27 NOTE — H&P (Signed)
Carmen [name]    Chief Complaint: Right proximal humerus fracture  HPI: The patient is a 71 y.o. female with s displaced right 2 part proximal humerus fracture  Past Medical History:  Diagnosis Date  . GERD (gastroesophageal reflux disease)   . Hypercholesterolemia   . Hypertension   . Thyroid disease     Past Surgical History:  Procedure Laterality Date  . ABDOMINAL HYSTERECTOMY    . CHOLECYSTECTOMY    . COLONOSCOPY    . ELBOW ARTHROSCOPY    . FINGER SURGERY    . FOOT TENDON SURGERY    . SHOULDER SURGERY      Family History  Problem Relation Age of Onset  . Diabetes Father     Social History:  reports that she has never smoked. She has never used smokeless tobacco. She reports that she drinks about 1.2 oz of alcohol per week . She reports that she does not use drugs.   Medications Prior to Admission  Medication Sig Dispense Refill  . amLODipine (NORVASC) 5 MG tablet Take 5 mg by mouth daily.     . Coenzyme Q10 (COQ10) 100 MG CAPS Take 300 mg by mouth daily.     . Glucosamine-Chondroit-Vit C-Mn (GLUCOSAMINE CHONDR 1500 COMPLX) CAPS Take 2 capsules by mouth daily.    Marland Kitchen ibuprofen (ADVIL,MOTRIN) 200 MG tablet Take 200 mg by mouth every morning.    Marland Kitchen levothyroxine (SYNTHROID, LEVOTHROID) 75 MCG tablet Take 75 mcg by mouth daily before breakfast.    . losartan (COZAAR) 100 MG tablet Take 100 mg by mouth daily.     . Multiple Vitamins-Minerals (MULTIVITAMIN PO) Take 1 tablet by mouth daily.     . Omega-3 Fatty Acids (FISH OIL) 1000 MG CAPS Take 1 capsule by mouth daily.    Marland Kitchen omeprazole (PRILOSEC) 20 MG capsule Take 1 capsule (20 mg total) by mouth daily. 30 capsule 3  . Potassium 99 MG TABS Take 1 tablet by mouth daily.    . potassium chloride (K-DUR,KLOR-CON) 10 MEQ tablet Take 10 mEq by mouth daily.    . vitamin C (ASCORBIC ACID) 500 MG tablet Take 500 mg by mouth daily.       Physical Exam: right shoulder with painful and restricted motion as noted at recent office  visit  Vitals  Temp:  [98.2 F (36.8 C)] 98.2 F (36.8 C) (09/13 1305) Resp:  [16] 16 (09/13 1305) BP: (140)/(79) 140/79 (09/13 1305) SpO2:  [95 %] 95 % (09/13 1305) Weight:  [88.5 kg (195 lb)] 88.5 kg (195 lb) (09/13 1321)  Assessment/Plan  Impression:Displaced right proximal humerus fracture   Plan of Action: Procedure(s): OPEN REDUCTION INTERNAL FIXATION (ORIF) PROXIMAL HUMERUS FRACTURE  Carl Bleecker M Carmen [name] 01/27/2017, 2:47 PM Contact # [phone]

## 2017-01-27 NOTE — Op Note (Signed)
01/27/2017  4:54 PM  PATIENT:   Carmen [name]  71 y.o. female  PRE-OPERATIVE DIAGNOSIS:  Displaced Right 2 part proximal humerus fracture   POST-OPERATIVE DIAGNOSIS:  same  PROCEDURE:  ORIF  SURGEON:  Kaile Bixler, Metta Clines M.D.  ASSISTANTS: Shuford pac   ANESTHESIA:   GET + ISB  EBL: 100  SPECIMEN:  none  Drains: none   PATIENT DISPOSITION:  PACU - hemodynamically stable.    PLAN OF CARE: Admit for overnight observation  Dictation# [id number]   Contact # [phone]

## 2017-01-27 NOTE — Transfer of Care (Signed)
Immediate Anesthesia Transfer of Care Note  Patient: Carmen [name]  Procedure(s) Performed: Procedure(s): OPEN REDUCTION INTERNAL FIXATION (ORIF) PROXIMAL HUMERUS FRACTURE (Right)  Patient Location: PACU  Anesthesia Type:GA combined with regional for post-op pain  Level of Consciousness: alert , drowsy and patient cooperative  Airway & Oxygen Therapy: Patient Spontanous Breathing and Patient connected to nasal cannula oxygen  Post-op Assessment: Report given to RN and Post -op Vital signs reviewed and stable  Post vital signs: Reviewed and stable  Last Vitals:  Vitals:   01/27/17 1504 01/27/17 1505  BP: 131/71 131/71  Pulse: 92 90  Resp: 16 19  Temp:    SpO2: 93% 94%    Last Pain:  Vitals:   01/27/17 1505  TempSrc:   PainSc: 0-No pain      Patients Stated Pain Goal: 2 (69/45/03 8882)  Complications: No apparent anesthesia complications

## 2017-01-27 NOTE — Anesthesia Procedure Notes (Signed)
Anesthesia Regional Block: Interscalene brachial plexus block   Pre-Anesthetic Checklist: ,, timeout performed, Correct Patient, Correct Site, Correct Laterality, Correct Procedure, Correct Position, site marked, Risks and benefits discussed,  Surgical consent,  Pre-op evaluation,  At surgeon's request and post-op pain management  Laterality: Right  Prep: chloraprep       Needles:  Injection technique: Single-shot  Needle Type: Echogenic Needle     Needle Length: 9cm  Needle Gauge: 21     Additional Needles:   Procedures:,,,, ultrasound used (permanent image in chart),,,,  Narrative:  Start time: 01/27/2017 2:50 PM End time: 01/27/2017 3:00 PM Injection made incrementally with aspirations every 5 mL.  Performed by: Personally  Anesthesiologist: Suella Broad D  Additional Notes: Tolerated well. No issues.

## 2017-01-28 ENCOUNTER — Encounter (HOSPITAL_COMMUNITY): Payer: Self-pay | Admitting: General Practice

## 2017-01-28 DIAGNOSIS — S42291A Other displaced fracture of upper end of right humerus, initial encounter for closed fracture: Secondary | ICD-10-CM | POA: Diagnosis not present

## 2017-01-28 DIAGNOSIS — Z880 Allergy status to penicillin: Secondary | ICD-10-CM | POA: Diagnosis not present

## 2017-01-28 DIAGNOSIS — Z91048 Other nonmedicinal substance allergy status: Secondary | ICD-10-CM | POA: Diagnosis not present

## 2017-01-28 DIAGNOSIS — K219 Gastro-esophageal reflux disease without esophagitis: Secondary | ICD-10-CM | POA: Diagnosis not present

## 2017-01-28 DIAGNOSIS — Z888 Allergy status to other drugs, medicaments and biological substances status: Secondary | ICD-10-CM | POA: Diagnosis not present

## 2017-01-28 DIAGNOSIS — I10 Essential (primary) hypertension: Secondary | ICD-10-CM | POA: Diagnosis not present

## 2017-01-28 MED ORDER — OXYCODONE-ACETAMINOPHEN 5-325 MG PO TABS: 1.0000 | ORAL_TABLET | Freq: Three times a day (TID) | ORAL | 0 refills | Status: AC | PRN

## 2017-01-28 NOTE — Op Note (Signed)
NAME:  Carmen [name], Carmen [name]                    ACCOUNT NO.:  MEDICAL RECORD NO.:  [id number]  LOCATION:                                 FACILITY:  PHYSICIAN:  Metta Clines. Octavie Westerhold, M.D.       DATE OF BIRTH:  DATE OF PROCEDURE:  01/27/2017 DATE OF DISCHARGE:                              OPERATIVE REPORT   POSTOPERATIVE DIAGNOSIS:  Displaced right 2-part proximal humerus fracture.  POSTOPERATIVE DIAGNOSIS:  Displaced right 2-part proximal humerus fracture.  PROCEDURE:  Open reduction and internal fixation of displaced right 2- part proximal humerus fracture.  SURGEON:  Metta Clines. Marium Ragan, Md.  ASSISTANT:  Olivia Mackie A. Shuford, PA-C.  ANESTHESIA:  General endotracheal as well as interscalene block.  ESTIMATED BLOOD LOSS:  100 mL.  DRAINS:  None.  HISTORY:  [name] is a 71 year old female, who fell last week sustaining an initially minimally displaced 2-part proximal humerus fracture, although she had an oblique fracture pattern on followup x- rays this week, there had been further displacement and angulation.  Due to the degree of displacement, I discussed with her potential open reduction and internal fixation and possible risks versus benefits thereof.  Possible surgical complications were reviewed including bleeding, infection, neurovascular injury, persistent pain, loss of motion, malunion, nonunion, loss of fixation, and possible need for additional surgery.  She understands and accepts and considering potential further displacement and instability, agrees with our planned ORIF.  PROCEDURE IN DETAIL:  After undergoing routine preop evaluation, the patient received prophylactic antibiotics.  Interscalene block was established in the holding area by the Anesthesia Department.  Placed supine on the operating table.  Underwent smooth induction of a general endotracheal anesthesia.  Placed in a beach chair position and appropriately padded and protected.  Right shoulder girdle region  was sterilely prepped and draped in standard fashion.  We did confirm that we could properly visualize the proximal humerus beginning of the case. Time-out was called.  An anterior deltopectoral approach to the right shoulder was made through a 10-cm incision.  Skin flaps were elevated. Dissection carried deeply.  The interval was developed from proximal to distal taking the vein laterally with the deltoid.  We then mobilized the conjoined tendon and retracted it medially.  Gained access to the anterolateral margin of the humeral shaft at the metaphyseal-diaphyseal junction where we identified the fracture site.  This was then manipulated and reduced under direct visualization.  We provisionally placed a Biomet proximal humeral plate over the anterolateral cortex of the humerus just posterior to the biceps tendon and then held this in place with a bone clamp.  After completing the reduction maneuver and provisional placement of the plate, we obtained fluoroscopic imaging, which showed good alignment of the fracture site and good position of the plate.  I placed a central guidepin up into the center of the humeral head and then a single screw into the shaft.  Once we had overall good alignment, we went ahead and placed the proximal locking pegs and then completed fixation with 2 screws in the shaft additionally.  The final fluoroscopic images showed good position of the pegs and plate and screws in  the shaft, good alignment of the fracture site.  Final images were obtained.  The wound was then copiously irrigated, and hemostasis was obtained.  The deltopectoral interval was then reapproximated with a series of figure-of-eight #1 Vicryl sutures. 2-0 Vicryl was used subcu layer, intracuticular 3-0 Monocryl for the skin followed by Dermabond and Aquacel dressing.  Right arm was placed into a sling.  The patient was awakened, extubated, and taken to recovery room in stable condition.  Jenetta Loges, PA-C was used as an Environmental consultant throughout this case, was essential for help with positioning the patient, positioning the extremity, tissue manipulation, maintenance of the reduction, wound closure, and intraoperative decision making.     Metta Clines. Celester Lech, M.D.     KMS/MEDQ  D:  01/27/2017  T:  01/27/2017  Job:  [id number]

## 2017-01-28 NOTE — Discharge Summary (Signed)
PATIENT ID:      Carmen [name]  MRN:     [id number] DOB/AGE:    71/20/1947 / 71 y.o.     DISCHARGE SUMMARY  ADMISSION DATE:    01/27/2017 DISCHARGE DATE:    ADMISSION DIAGNOSIS: Right proximal humerus fracture  Past Medical History:  Diagnosis Date  . GERD (gastroesophageal reflux disease)   . Hypercholesterolemia   . Hypertension   . Thyroid disease     DISCHARGE DIAGNOSIS:   Active Problems:   Proximal humerus fracture   PROCEDURE: Procedure(s): OPEN REDUCTION INTERNAL FIXATION (ORIF) PROXIMAL HUMERUS FRACTURE on 01/27/2017  CONSULTS:    HISTORY:  See H&P in chart.  HOSPITAL COURSE:  Carmen [name] is a 71 y.o. admitted on 01/27/2017 with a diagnosis of Right proximal humerus fracture .  They were brought to the operating room on 01/27/2017 and underwent Procedure(s): OPEN REDUCTION INTERNAL FIXATION (ORIF) PROXIMAL HUMERUS FRACTURE.    They were given perioperative antibiotics: Anti-infectives    Start     Dose/Rate Route Frequency Ordered Stop   01/28/17 0600  ceFAZolin (ANCEF) IVPB 2g/100 mL premix     2 g 200 mL/hr over 30 Minutes Intravenous On call to O.R. 01/27/17 1256 01/27/17 1600   01/27/17 2200  ceFAZolin (ANCEF) IVPB 2g/100 mL premix     2 g 200 mL/hr over 30 Minutes Intravenous Every 6 hours 01/27/17 1824 01/28/17 0900   01/27/17 1312  ceFAZolin (ANCEF) 2-4 GM/100ML-% IVPB    Comments:  Ardine Eng   : cabinet override      01/27/17 1312 01/27/17 1600    .  Patient underwent the above named procedure and tolerated it well. The following day they were hemodynamically stable and pain was controlled on oral analgesics. They were neurovascularly intact to the operative extremity. OT was ordered and worked with patient per protocol. They were medically and orthopaedically stable for discharge on day 1 .     DIAGNOSTIC STUDIES:  RECENT RADIOGRAPHIC STUDIES :  Dg Shoulder Right  Result Date: 01/27/2017 CLINICAL DATA:  Intraoperative fluoroscopy for ORIF of  the right proximal humerus fracture. EXAM: DG C-ARM 61-120 MIN; RIGHT SHOULDER - 2+ VIEW COMPARISON:  None. FINDINGS: 39 seconds of fluoroscopic time was utilized. Two C-arm fluoroscopic views of the right shoulder are provided demonstrating new plate and screw fixation across a known right surgical neck fracture of the humerus. Alignment appears near anatomic. No media postoperative complications are identified. Joint spaces are maintained. IMPRESSION: ORIF of right proximal humerus fracture with no immediate complications. Electronically Signed   By: Ashley Royalty M.D.   On: 01/27/2017 17:13   Dg Shoulder Right  Result Date: 01/16/2017 CLINICAL DATA:  Right shoulder pain after falling this evening. EXAM: RIGHT SHOULDER - 2+ VIEW COMPARISON:  11/13/2008 FINDINGS: No external rotation view is provided, and I suspect that the patient is probably unable to assume the external rotation view. There is cortical discontinuity posteriorly along the surgical neck of the humerus favoring a surgical neck fracture. This is only seen on the internal rotation view. The transscapular view is blurred but indicates normal glenohumeral alignment. AC joint intact. Left basilar bandlike density favoring plate like atelectasis. IMPRESSION: 1. Relatively nondisplaced surgical neck fracture of the right proximal humerus. This is poorly characterized as the patient was unable to assume a normal external rotation view. 2. Subsegmental atelectasis at the right lung base. Electronically Signed   By: Van Clines M.D.   On: 01/16/2017 16:00   Dg C-arm 1-60  Min  Result Date: 01/27/2017 CLINICAL DATA:  Intraoperative fluoroscopy for ORIF of the right proximal humerus fracture. EXAM: DG C-ARM 61-120 MIN; RIGHT SHOULDER - 2+ VIEW COMPARISON:  None. FINDINGS: 39 seconds of fluoroscopic time was utilized. Two C-arm fluoroscopic views of the right shoulder are provided demonstrating new plate and screw fixation across a known right  surgical neck fracture of the humerus. Alignment appears near anatomic. No media postoperative complications are identified. Joint spaces are maintained. IMPRESSION: ORIF of right proximal humerus fracture with no immediate complications. Electronically Signed   By: Ashley Royalty M.D.   On: 01/27/2017 17:13    RECENT VITAL SIGNS:  Patient Vitals for the past 24 hrs:  BP Temp Temp src Pulse Resp SpO2 Height Weight  01/28/17 0646 113/68 98.2 F (36.8 C) Oral 85 16 94 % - -  01/27/17 2027 128/71 (!) 97.5 F (36.4 C) Oral 89 16 96 % - -  01/27/17 1832 140/65 97.6 F (36.4 C) Oral 79 - 95 % - -  01/27/17 1815 (!) 141/74 (!) 97.5 F (36.4 C) - 88 19 97 % - -  01/27/17 1800 (!) 143/68 - - 79 14 97 % - -  01/27/17 1745 126/66 - - 81 13 97 % - -  01/27/17 1736 - - - 80 12 97 % - -  01/27/17 1730 138/76 97.9 F (36.6 C) - 84 16 96 % - -  01/27/17 1505 131/71 - - 90 19 94 % - -  01/27/17 1504 131/71 - - 92 16 93 % - -  01/27/17 1500 - - - 91 16 96 % - -  01/27/17 1455 - - - 99 17 96 % - -  01/27/17 1450 - - - - 20 - - -  01/27/17 1445 - - - - (!) 31 - - -  01/27/17 1321 - - - - - - 5\' 7"  (1.702 m) 88.5 kg (195 lb)  01/27/17 1305 140/79 98.2 F (36.8 C) Oral - 16 95 % - 88.5 kg (195 lb)  .  RECENT EKG RESULTS:    Orders placed or performed during the hospital encounter of 01/27/17  . EKG 12 lead  . EKG 12 lead    DISCHARGE INSTRUCTIONS:    DISCHARGE MEDICATIONS:   Allergies as of 01/28/2017      Reactions   Penicillins Other (See Comments)   Unknown reaction   Tetanus Toxoids Other (See Comments)   Flu symptoms   Adhesive [tape] Rash      Medication List    TAKE these medications   amLODipine 5 MG tablet Commonly known as:  NORVASC Take 5 mg by mouth daily.   CoQ10 100 MG Caps Take 300 mg by mouth daily.   Fish Oil 1000 MG Caps Take 1 capsule by mouth daily.   GLUCOSAMINE CHONDR 1500 COMPLX Caps Take 2 capsules by mouth daily.   ibuprofen 200 MG tablet Commonly  known as:  ADVIL,MOTRIN Take 200 mg by mouth every morning.   levothyroxine 75 MCG tablet Commonly known as:  SYNTHROID, LEVOTHROID Take 75 mcg by mouth daily before breakfast.   losartan 100 MG tablet Commonly known as:  COZAAR Take 100 mg by mouth daily.   MULTIVITAMIN PO Take 1 tablet by mouth daily.   omeprazole 20 MG capsule Commonly known as:  PRILOSEC Take 1 capsule (20 mg total) by mouth daily.   oxyCODONE-acetaminophen 5-325 MG tablet Commonly known as:  PERCOCET Take 1 tablet by mouth every 8 (eight)  hours as needed.   Potassium 99 MG Tabs Take 1 tablet by mouth daily.   potassium chloride 10 MEQ tablet Commonly known as:  K-DUR,KLOR-CON Take 10 mEq by mouth daily.   vitamin C 500 MG tablet Commonly known as:  ASCORBIC ACID Take 500 mg by mouth daily.            Discharge Care Instructions        Start     Ordered   01/28/17 0000  oxyCODONE-acetaminophen (PERCOCET) 5-325 MG tablet  Every 8 hours PRN     01/28/17 0910      FOLLOW UP VISIT:   Follow-up Information    Justice Britain, MD.   Specialty:  Orthopedic Surgery Why:  call to be seen in 10-14 days Contact information: [address] [place] [address] [phone]           DISCHARGE TO: Home   DISCHARGE CONDITION:  Festus Barren for Dr. Justice Britain 01/28/2017, 9:10 AM

## 2017-01-28 NOTE — Care Management Note (Signed)
Case Management Note  Patient Details  Name: Carmen [name] MRN: [id number] Date of Birth: 02-16-1946  Subjective/Objective:                 Patient from home, fell doing laundry. In obs for Displaced Right 2 part proximal humerus fracture with ORIF 9/13.    Action/Plan:  CM will continue to follow for DC planning.  Expected Discharge Date:                  Expected Discharge Plan:  Home/Self Care  In-House Referral:     Discharge planning Services  CM Consult  Post Acute Care Choice:    Choice offered to:     DME Arranged:    DME Agency:     HH Arranged:    HH Agency:     Status of Service:  In process, will continue to follow  If discussed at Long Length of Stay Meetings, dates discussed:    Additional Comments:  Carles Collet, RN 01/28/2017, 7:47 AM

## 2017-01-28 NOTE — Progress Notes (Signed)
Discharge teaching complete. Meds, diet, activity, incision care and follow up appointments reviewed and all questions answered. Copy of instructions and prescriptions given to patient. Patient discharged via wheelchair with husband.

## 2017-01-28 NOTE — Evaluation (Signed)
Occupational Therapy Evaluation Patient Details Name: Carmen [name] MRN: [id number] DOB: [birth year] Today's Date: 01/28/2017    History of Present Illness s/p OPEN REDUCTION INTERNAL FIXATION (ORIF) PROXIMAL HUMERUS FRACTURE   Clinical Impression   Pt admitted with the above diagnoses and presents with below problem list. PTA pt was independent with ADLs. Pt is currently setup to min A with ADLs. ADL, sling, exercise and precaution education completed with pt. Session details below. Pt is for d/c home today. No further acute OT needs indicated. OT signing off.     Follow Up Recommendations  DC plan and follow up therapy as arranged by surgeon    Equipment Recommendations  None recommended by OT    Recommendations for Other Services       Precautions / Restrictions Precautions Precautions: Shoulder Type of Shoulder Precautions: active protocal Shoulder Interventions: Shoulder sling/immobilizer;Off for dressing/bathing/exercises Precaution Booklet Issued: Yes (comment) Required Braces or Orthoses: Sling Restrictions Weight Bearing Restrictions: Yes RUE Weight Bearing: Non weight bearing      Mobility Bed Mobility Overal bed mobility: Modified Independent             General bed mobility comments: has adjustable bed, no rails at home. HOB slightly elevated and pt used L elbow into mattress to power up to EOB.  Transfers Overall transfer level: Needs assistance   Transfers: Sit to/from Stand Sit to Stand: Supervision         General transfer comment: from EOB    Balance Overall balance assessment: Needs assistance;History of Falls         Standing balance support: No upper extremity supported Standing balance-Leahy Scale: Good                             ADL either performed or assessed with clinical judgement   ADL Overall ADL's : Needs assistance/impaired Eating/Feeding: Set up;Sitting   Grooming: Set up;Minimal  assistance;Sitting;Standing   Upper Body Bathing: Set up;Sitting   Lower Body Bathing: Supervison/ safety;Sit to/from stand   Upper Body Dressing : Minimal assistance;Sitting   Lower Body Dressing: Min guard;Sit to/from stand   Toilet Transfer: Supervision/safety;Ambulation   Toileting- Clothing Manipulation and Hygiene: Supervision/safety   Tub/ Shower Transfer: Walk-in shower;Supervision/safety;Ambulation   Functional mobility during ADLs: Supervision/safety General ADL Comments: Pt completed bed mobility, UB/LB dressing, in-room functional mobility.      Vision Baseline Vision/History: Wears glasses       Perception     Praxis      Pertinent Vitals/Pain Pain Assessment: 0-10 Pain Score: 3  Pain Location: RUE Pain Descriptors / Indicators: Aching;Sore Pain Intervention(s): Limited activity within patient's tolerance;Monitored during session;Repositioned     Hand Dominance Right   Extremity/Trunk Assessment Upper Extremity Assessment Upper Extremity Assessment: RUE deficits/detail RUE Deficits / Details: s/p OPEN REDUCTION INTERNAL FIXATION (ORIF) PROXIMAL HUMERUS FRACTURE   Lower Extremity Assessment Lower Extremity Assessment: Overall WFL for tasks assessed       Communication Communication Communication: No difficulties   Cognition Arousal/Alertness: Awake/alert Behavior During Therapy: WFL for tasks assessed/performed Overall Cognitive Status: Within Functional Limits for tasks assessed                                     General Comments       Exercises Exercises: Shoulder Shoulder Exercises Pendulum Exercise: PROM;10 reps;Right;Standing Elbow Flexion: AROM;Right;10 reps;Supine Elbow Extension: AROM;Right;10 reps;Supine Wrist  Flexion: AROM;Right;5 reps Wrist Extension: AROM;Right;5 reps Digit Composite Flexion: AROM;Right;5 reps Composite Extension: AROM;Right;5 reps   Shoulder Instructions Shoulder  Instructions Donning/doffing shirt without moving shoulder: Minimal assistance Method for sponge bathing under operated UE: Independent Donning/doffing sling/immobilizer: Minimal assistance Correct positioning of sling/immobilizer: Modified independent Pendulum exercises (written home exercise program): Modified independent ROM for elbow, wrist and digits of operated UE: Independent Sling wearing schedule (on at all times/off for ADL's): Modified independent Proper positioning of operated UE when showering: Independent Positioning of UE while sleeping: Modified independent    Home Living Family/patient expects to be discharged to:: Private residence Living Arrangements: Spouse/significant other Available Help at Discharge: Family Type of Home: House       Home Layout: Two level;Able to live on main level with bedroom/bathroom     Bathroom Shower/Tub: Occupational psychologist: Standard                Prior Functioning/Environment Level of Independence: Independent                 OT Problem List: Impaired balance (sitting and/or standing);Decreased knowledge of use of DME or AE;Decreased knowledge of precautions;Impaired UE functional use;Pain      OT Treatment/Interventions:      OT Goals(Current goals can be found in the care plan section)    OT Frequency:     Barriers to D/C:            Co-evaluation              AM-PAC PT "6 Clicks" Daily Activity     Outcome Measure Help from another person eating meals?: None Help from another person taking care of personal grooming?: A Little Help from another person toileting, which includes using toliet, bedpan, or urinal?: None Help from another person bathing (including washing, rinsing, drying)?: A Little Help from another person to put on and taking off regular upper body clothing?: A Little Help from another person to put on and taking off regular lower body clothing?: None 6 Click Score: 21    End of Session Equipment Utilized During Treatment: Other (comment) (sling)  Activity Tolerance: Patient tolerated treatment well Patient left: in chair;with call bell/phone within reach  OT Visit Diagnosis: Pain Pain - Right/Left: Right Pain - part of body: Shoulder                Time: 3570-1779 OT Time Calculation (min): 41 min Charges:  OT General Charges $OT Visit: 1 Visit OT Evaluation $OT Eval Low Complexity: 1 Low OT Treatments $Self Care/Home Management : 8-22 mins $Therapeutic Exercise: 8-22 mins G-Codes: OT G-codes **NOT FOR INPATIENT CLASS** Functional Assessment Tool Used: AM-PAC 6 Clicks Daily Activity Functional Limitation: Self care Self Care Current Status (T9030): At least 20 percent but less than 40 percent impaired, limited or restricted Self Care Discharge Status [phone]): At least 20 percent but less than 40 percent impaired, limited or restricted     Hortencia Pilar 01/28/2017, 11:23 AM

## 2017-01-30 ENCOUNTER — Encounter (HOSPITAL_COMMUNITY): Payer: Self-pay | Admitting: Orthopedic Surgery

## 2017-01-31 DIAGNOSIS — Z4889 Encounter for other specified surgical aftercare: Secondary | ICD-10-CM | POA: Diagnosis not present

## 2017-01-31 DIAGNOSIS — S42201D Unspecified fracture of upper end of right humerus, subsequent encounter for fracture with routine healing: Secondary | ICD-10-CM | POA: Diagnosis not present

## 2017-02-09 DIAGNOSIS — S42221D 2-part displaced fracture of surgical neck of right humerus, subsequent encounter for fracture with routine healing: Secondary | ICD-10-CM | POA: Diagnosis not present

## 2017-02-09 DIAGNOSIS — Z4889 Encounter for other specified surgical aftercare: Secondary | ICD-10-CM | POA: Diagnosis not present

## 2017-02-27 DIAGNOSIS — Z23 Encounter for immunization: Secondary | ICD-10-CM | POA: Diagnosis not present

## 2017-03-16 DIAGNOSIS — S42291D Other displaced fracture of upper end of right humerus, subsequent encounter for fracture with routine healing: Secondary | ICD-10-CM | POA: Diagnosis not present

## 2017-03-16 DIAGNOSIS — Z4889 Encounter for other specified surgical aftercare: Secondary | ICD-10-CM | POA: Diagnosis not present

## 2017-03-23 DIAGNOSIS — M25611 Stiffness of right shoulder, not elsewhere classified: Secondary | ICD-10-CM | POA: Diagnosis not present

## 2017-03-25 DIAGNOSIS — M25611 Stiffness of right shoulder, not elsewhere classified: Secondary | ICD-10-CM | POA: Diagnosis not present

## 2017-03-30 DIAGNOSIS — M25611 Stiffness of right shoulder, not elsewhere classified: Secondary | ICD-10-CM | POA: Diagnosis not present

## 2017-04-01 DIAGNOSIS — M25611 Stiffness of right shoulder, not elsewhere classified: Secondary | ICD-10-CM | POA: Diagnosis not present

## 2017-04-11 DIAGNOSIS — M25611 Stiffness of right shoulder, not elsewhere classified: Secondary | ICD-10-CM | POA: Diagnosis not present

## 2017-04-13 DIAGNOSIS — Z4889 Encounter for other specified surgical aftercare: Secondary | ICD-10-CM | POA: Diagnosis not present

## 2017-04-13 DIAGNOSIS — S42221D 2-part displaced fracture of surgical neck of right humerus, subsequent encounter for fracture with routine healing: Secondary | ICD-10-CM | POA: Diagnosis not present

## 2017-04-14 DIAGNOSIS — M25611 Stiffness of right shoulder, not elsewhere classified: Secondary | ICD-10-CM | POA: Diagnosis not present

## 2017-04-22 DIAGNOSIS — H35363 Drusen (degenerative) of macula, bilateral: Secondary | ICD-10-CM | POA: Diagnosis not present

## 2017-04-22 DIAGNOSIS — H35033 Hypertensive retinopathy, bilateral: Secondary | ICD-10-CM | POA: Diagnosis not present

## 2017-04-22 DIAGNOSIS — H2513 Age-related nuclear cataract, bilateral: Secondary | ICD-10-CM | POA: Diagnosis not present

## 2017-05-03 DIAGNOSIS — M7661 Achilles tendinitis, right leg: Secondary | ICD-10-CM | POA: Diagnosis not present

## 2017-05-18 DIAGNOSIS — E785 Hyperlipidemia, unspecified: Secondary | ICD-10-CM | POA: Diagnosis not present

## 2017-05-18 DIAGNOSIS — R7303 Prediabetes: Secondary | ICD-10-CM | POA: Diagnosis not present

## 2017-05-18 DIAGNOSIS — E039 Hypothyroidism, unspecified: Secondary | ICD-10-CM | POA: Diagnosis not present

## 2017-05-18 DIAGNOSIS — F5101 Primary insomnia: Secondary | ICD-10-CM | POA: Diagnosis not present

## 2017-05-18 DIAGNOSIS — I10 Essential (primary) hypertension: Secondary | ICD-10-CM | POA: Diagnosis not present

## 2017-05-24 DIAGNOSIS — M7661 Achilles tendinitis, right leg: Secondary | ICD-10-CM | POA: Diagnosis not present

## 2017-05-24 DIAGNOSIS — M9261 Juvenile osteochondrosis of tarsus, right ankle: Secondary | ICD-10-CM | POA: Diagnosis not present

## 2017-05-24 DIAGNOSIS — M7671 Peroneal tendinitis, right leg: Secondary | ICD-10-CM | POA: Diagnosis not present

## 2017-05-24 DIAGNOSIS — M66871 Spontaneous rupture of other tendons, right ankle and foot: Secondary | ICD-10-CM | POA: Diagnosis not present

## 2017-05-24 DIAGNOSIS — G8918 Other acute postprocedural pain: Secondary | ICD-10-CM | POA: Diagnosis not present

## 2017-05-24 DIAGNOSIS — M6701 Short Achilles tendon (acquired), right ankle: Secondary | ICD-10-CM | POA: Diagnosis not present

## 2017-06-15 DIAGNOSIS — L239 Allergic contact dermatitis, unspecified cause: Secondary | ICD-10-CM | POA: Diagnosis not present

## 2017-07-14 DIAGNOSIS — M25571 Pain in right ankle and joints of right foot: Secondary | ICD-10-CM | POA: Diagnosis not present

## 2017-07-18 DIAGNOSIS — M25571 Pain in right ankle and joints of right foot: Secondary | ICD-10-CM | POA: Diagnosis not present

## 2017-07-21 DIAGNOSIS — M25571 Pain in right ankle and joints of right foot: Secondary | ICD-10-CM | POA: Diagnosis not present

## 2017-07-25 DIAGNOSIS — M25571 Pain in right ankle and joints of right foot: Secondary | ICD-10-CM | POA: Diagnosis not present

## 2017-07-26 DIAGNOSIS — R220 Localized swelling, mass and lump, head: Secondary | ICD-10-CM | POA: Diagnosis not present

## 2017-07-28 DIAGNOSIS — M25571 Pain in right ankle and joints of right foot: Secondary | ICD-10-CM | POA: Diagnosis not present

## 2017-08-01 DIAGNOSIS — M25571 Pain in right ankle and joints of right foot: Secondary | ICD-10-CM | POA: Diagnosis not present

## 2017-08-23 DIAGNOSIS — D2339 Other benign neoplasm of skin of other parts of face: Secondary | ICD-10-CM | POA: Diagnosis not present

## 2017-12-15 IMAGING — CT CT ABD-PELV W/O CM
1 of 2 series · 14 of 32 positions shown, 18 images · non-contrast
Comparison: None.

CLINICAL DATA: 70-year-old female with left lower quadrant pain and
pelvic pain for 10 days. Evaluate for possibly of diverticulitis.
Hypertension. Post cholecystectomy and hysterectomy. No history of
cancer. Initial encounter.

EXAM:
CT ABDOMEN AND PELVIS WITHOUT CONTRAST
TECHNIQUE: Multidetector CT imaging of the abdomen and pelvis was performed
following the standard protocol without IV contrast.

[Series 2: abd/pelvis w/(date) · axial · 0.76mm/px · z∈[+682,+1092]mm · 14 of 94 slices shown, 18 images]
[im 8/94  soft-tissue]
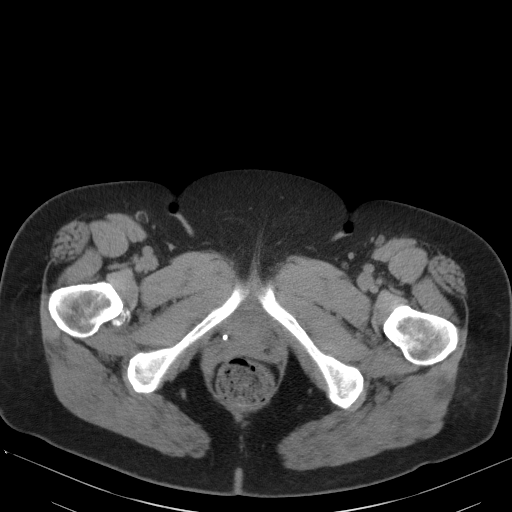
[im 8/94  bone]
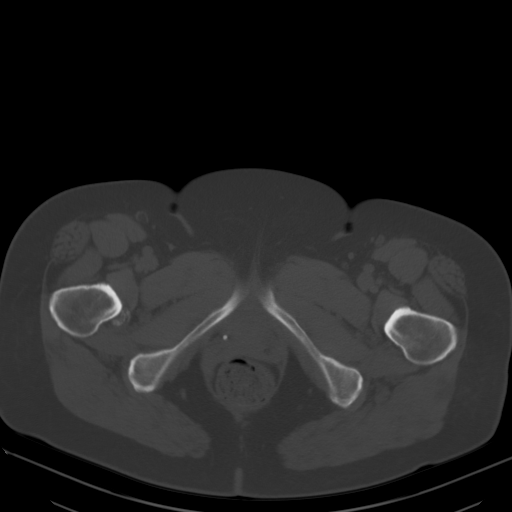
[im 15/94  soft-tissue]
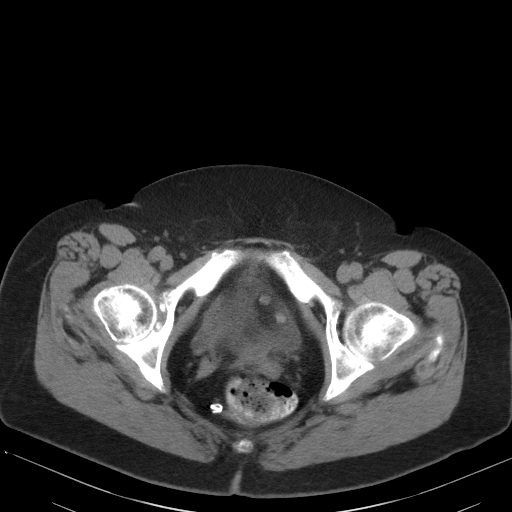
[im 23/94  soft-tissue]
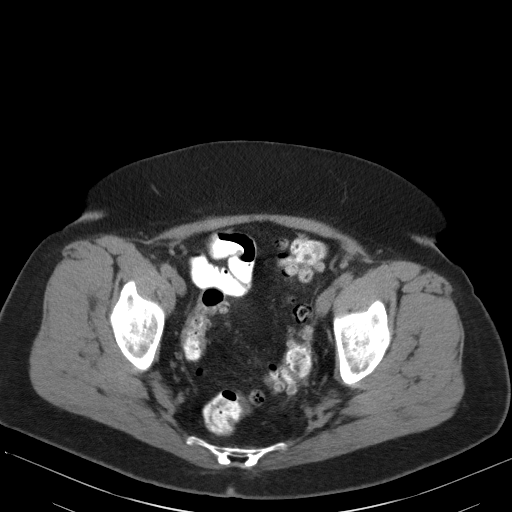
[im 30/94  soft-tissue]
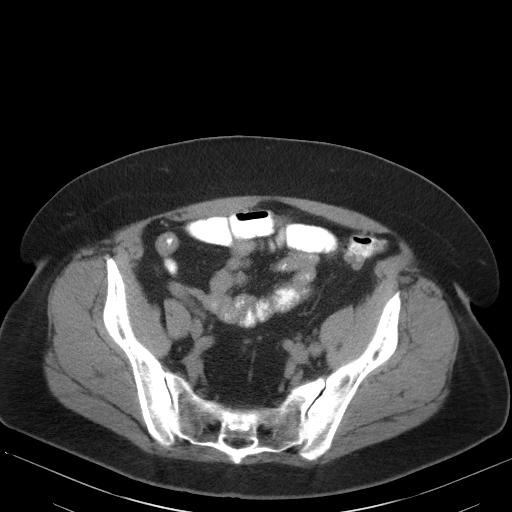
[im 38/94  soft-tissue]
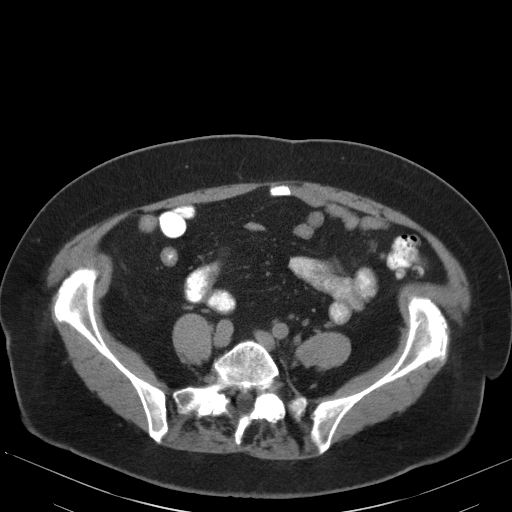
[im 45/94  soft-tissue]
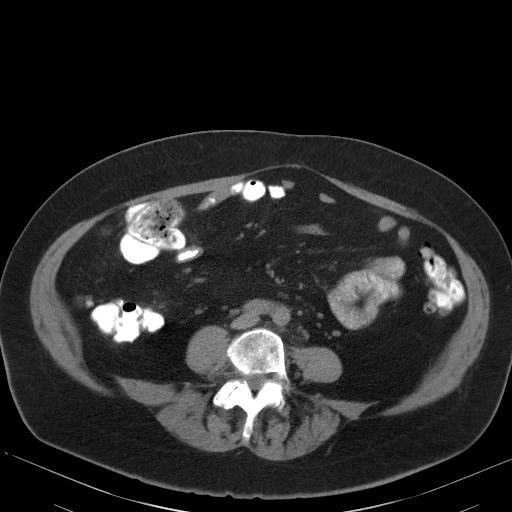
[im 53/94  soft-tissue]
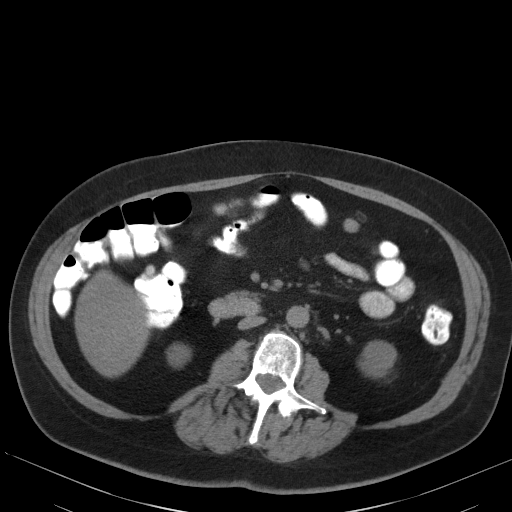
[im 60/94  soft-tissue]
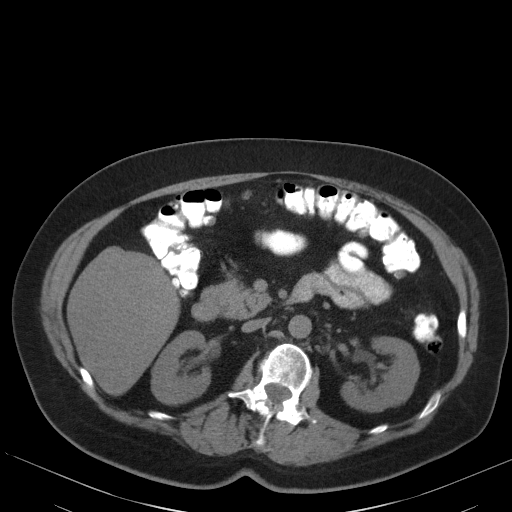
[im 67/94  soft-tissue]
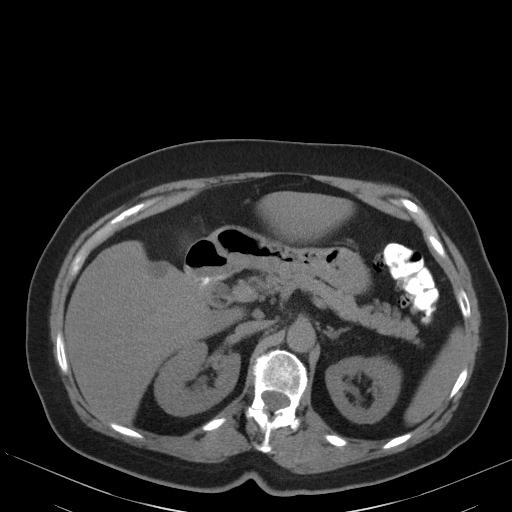
[im 67/94  bone]
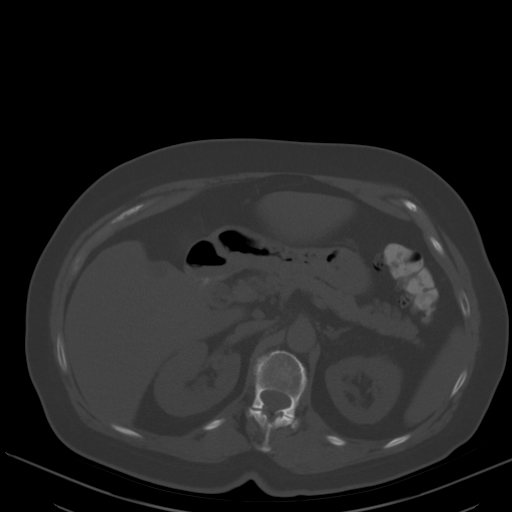
[im 75/94  soft-tissue]
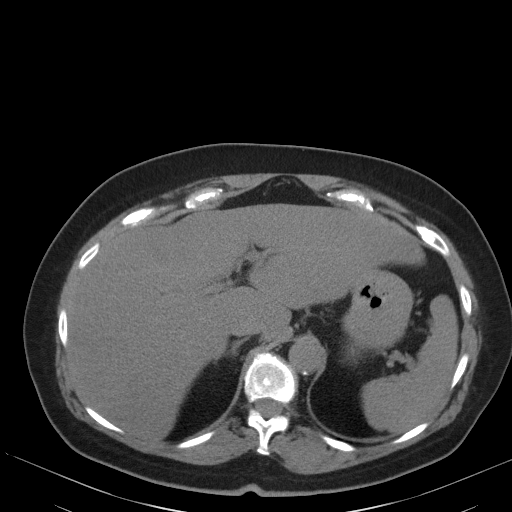
[im 79/94  lung]
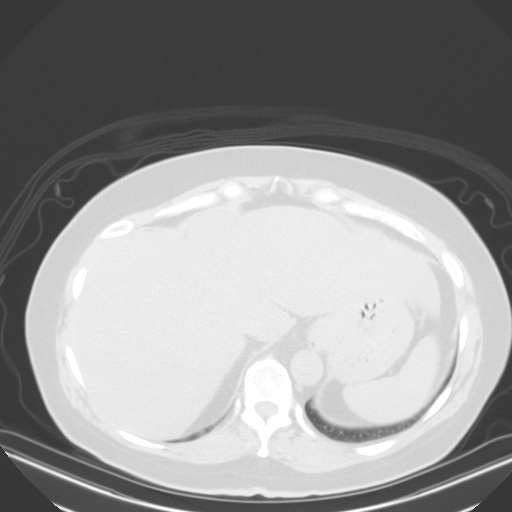
[im 82/94  soft-tissue]
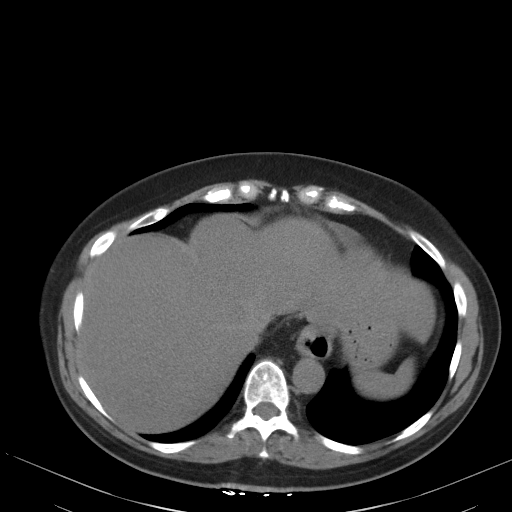
[im 82/94  lung]
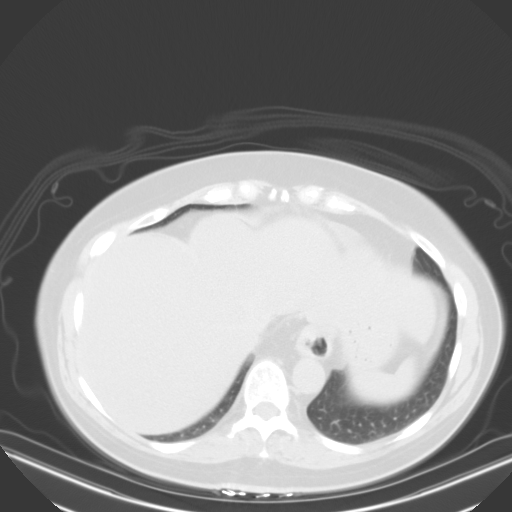
[im 86/94  lung]
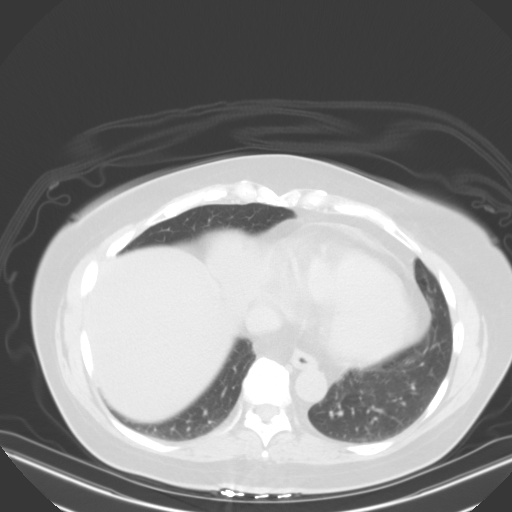
[im 90/94  soft-tissue]
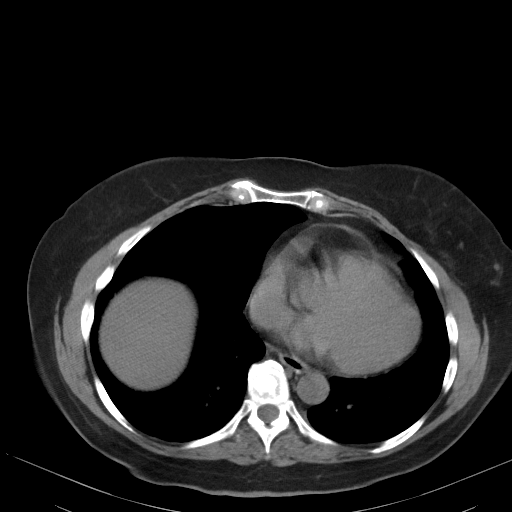
[im 90/94  lung]
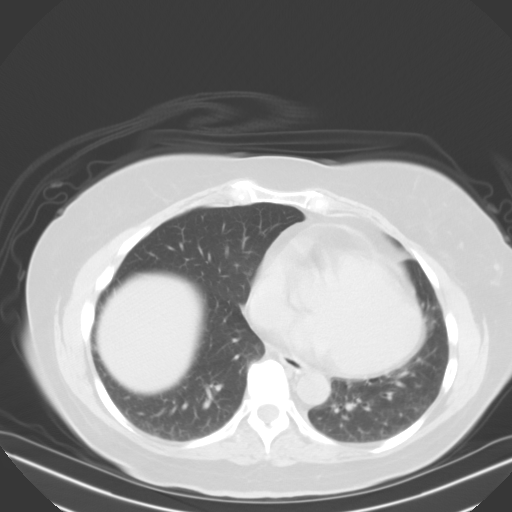

[14 of 32 positions shown; findings below may reference images not displayed]

FINDINGS: Lower chest: Clear.

Hepatobiliary: Elongated fatty liver spanning over 22.2 cm. Anterior
2 cm cyst. Post cholecystectomy.

Pancreas: Taking into account limitation by non contrast imaging, no
worrisome mass or inflammation.

Spleen: Taking into account limitation by non contrast imaging, no
mass or enlargement.

Adrenals/Urinary Tract: Taking into account limitation by non
contrast imaging, no renal or adrenal mass. No renal or ureteral
obstructing stone. No hydronephrosis.

Stomach/Bowel: Colonic diverticula most notable sigmoid colon.
Associated muscular hypertrophy. Poor definition of fat planes along
the inferior aspect of the sigmoid colon adjacent to the
hysterectomy site. Although it is possible this represents scar
tissue from the hysterectomy, in the present clinical setting, the
possibility of diverticulitis is raised. No comparison exams to
determine if this represents change. Given the degree of muscular
hypertrophy, difficult to evaluate for sigmoid colon mass. No
drainable abscess with free intraperitoneal air.

Small hiatal hernia. Under distended stomach limiting evaluation
without gross abnormality.

Vascular/Lymphatic: Mild calcified plaque abdominal aorta without
aneurysmal dilation.

Increased number of retroperitoneal lymph nodes largest left common
iliac region with short axis dimension up to 8.6 mm.
Significance/etiology indeterminate.

Reproductive: Post hysterectomy

Other: No worrisome abnormality.

Musculoskeletal: Scoliosis lumbar spine convex left with
superimposed degenerative changes. Various degrees of spinal
stenosis and foraminal narrowing.
IMPRESSION: Diverticulosis most notable sigmoid colon where there is associated
muscular hypertrophy. Poor definition of fat planes along the
inferior aspect of the sigmoid colon adjacent to the hysterectomy
site. Although it is possible this represents scar tissue from the
hysterectomy, in the present clinical setting, the possibility of
diverticulitis is raised. No comparison exams to determine if this
represents change. No drainable abscess or free air.

Increased number of retroperitoneal lymph nodes largest left common
iliac region with short axis dimension up to 8.6 mm.
Significance/etiology indeterminate.

Elongated fatty liver containing a 2 cm cyst.

Post cholecystectomy.

Small hiatal hernia.

Scoliosis lumbar spine with superimposed degenerative changes.

Aortic atherosclerosis.

These results will be called to the ordering clinician or
representative by the Radiologist Assistant, and communication
documented in the PACS or zVision Dashboard.

## 2017-12-27 DIAGNOSIS — Z1231 Encounter for screening mammogram for malignant neoplasm of breast: Secondary | ICD-10-CM | POA: Diagnosis not present

## 2017-12-27 DIAGNOSIS — Z803 Family history of malignant neoplasm of breast: Secondary | ICD-10-CM | POA: Diagnosis not present

## 2017-12-27 DIAGNOSIS — M8588 Other specified disorders of bone density and structure, other site: Secondary | ICD-10-CM | POA: Diagnosis not present

## 2018-01-30 DIAGNOSIS — N183 Chronic kidney disease, stage 3 (moderate): Secondary | ICD-10-CM | POA: Diagnosis not present

## 2018-01-30 DIAGNOSIS — E039 Hypothyroidism, unspecified: Secondary | ICD-10-CM | POA: Diagnosis not present

## 2018-01-30 DIAGNOSIS — R7303 Prediabetes: Secondary | ICD-10-CM | POA: Diagnosis not present

## 2018-01-30 DIAGNOSIS — Z0000 Encounter for general adult medical examination without abnormal findings: Secondary | ICD-10-CM | POA: Diagnosis not present

## 2018-01-30 DIAGNOSIS — E559 Vitamin D deficiency, unspecified: Secondary | ICD-10-CM | POA: Diagnosis not present

## 2018-01-30 DIAGNOSIS — I10 Essential (primary) hypertension: Secondary | ICD-10-CM | POA: Diagnosis not present

## 2018-01-30 DIAGNOSIS — E785 Hyperlipidemia, unspecified: Secondary | ICD-10-CM | POA: Diagnosis not present

## 2018-02-16 DIAGNOSIS — Z23 Encounter for immunization: Secondary | ICD-10-CM | POA: Diagnosis not present

## 2018-05-23 DIAGNOSIS — H35033 Hypertensive retinopathy, bilateral: Secondary | ICD-10-CM | POA: Diagnosis not present

## 2018-10-18 IMAGING — CR DG SHOULDER 2+V*R*
2 series · 2 of 2 positions shown · non-contrast
Comparison: 11/13/2008

CLINICAL DATA: Right shoulder pain after falling this evening.

EXAM:
RIGHT SHOULDER - 2+ VIEW

[x shoulder ap right (1 of 2)]
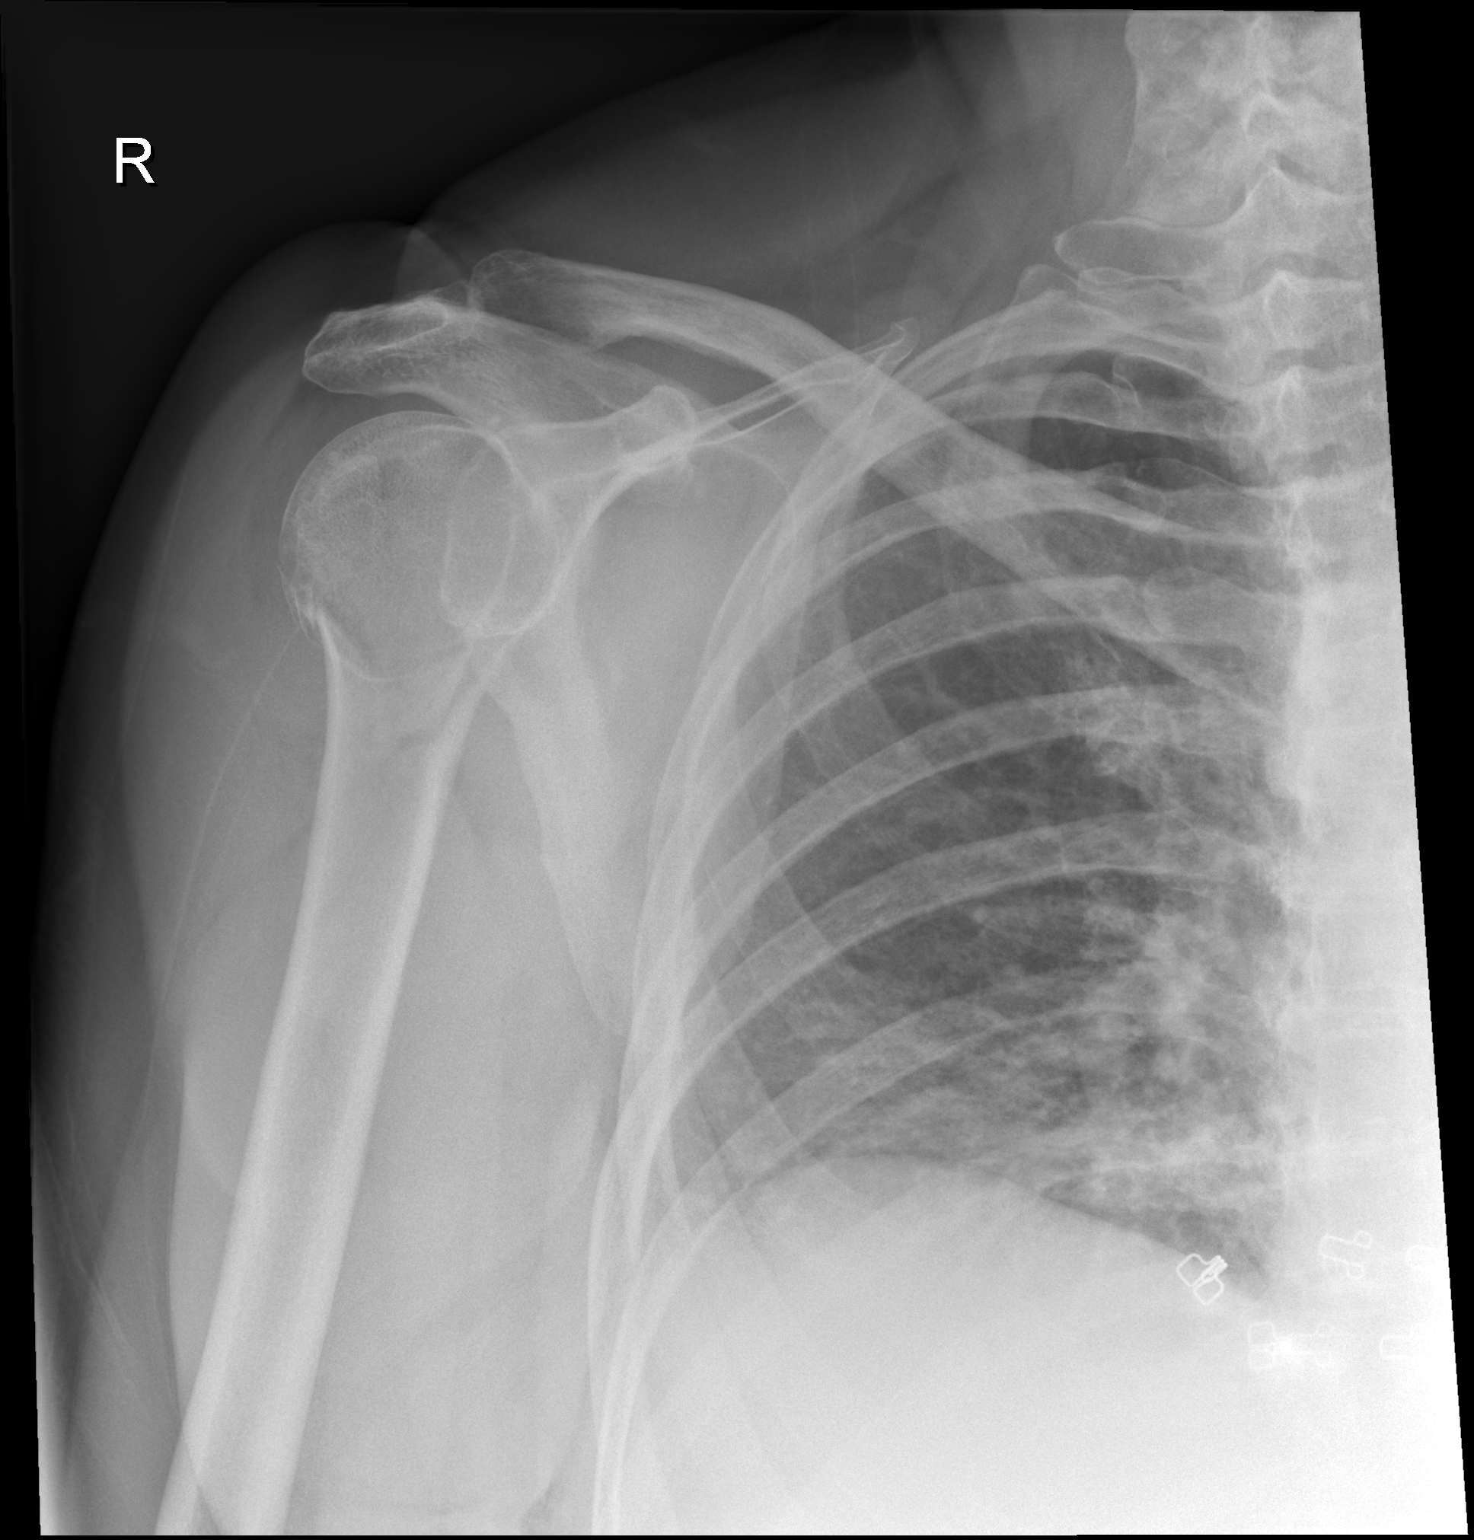

[x shoulder ap right (2 of 2)]
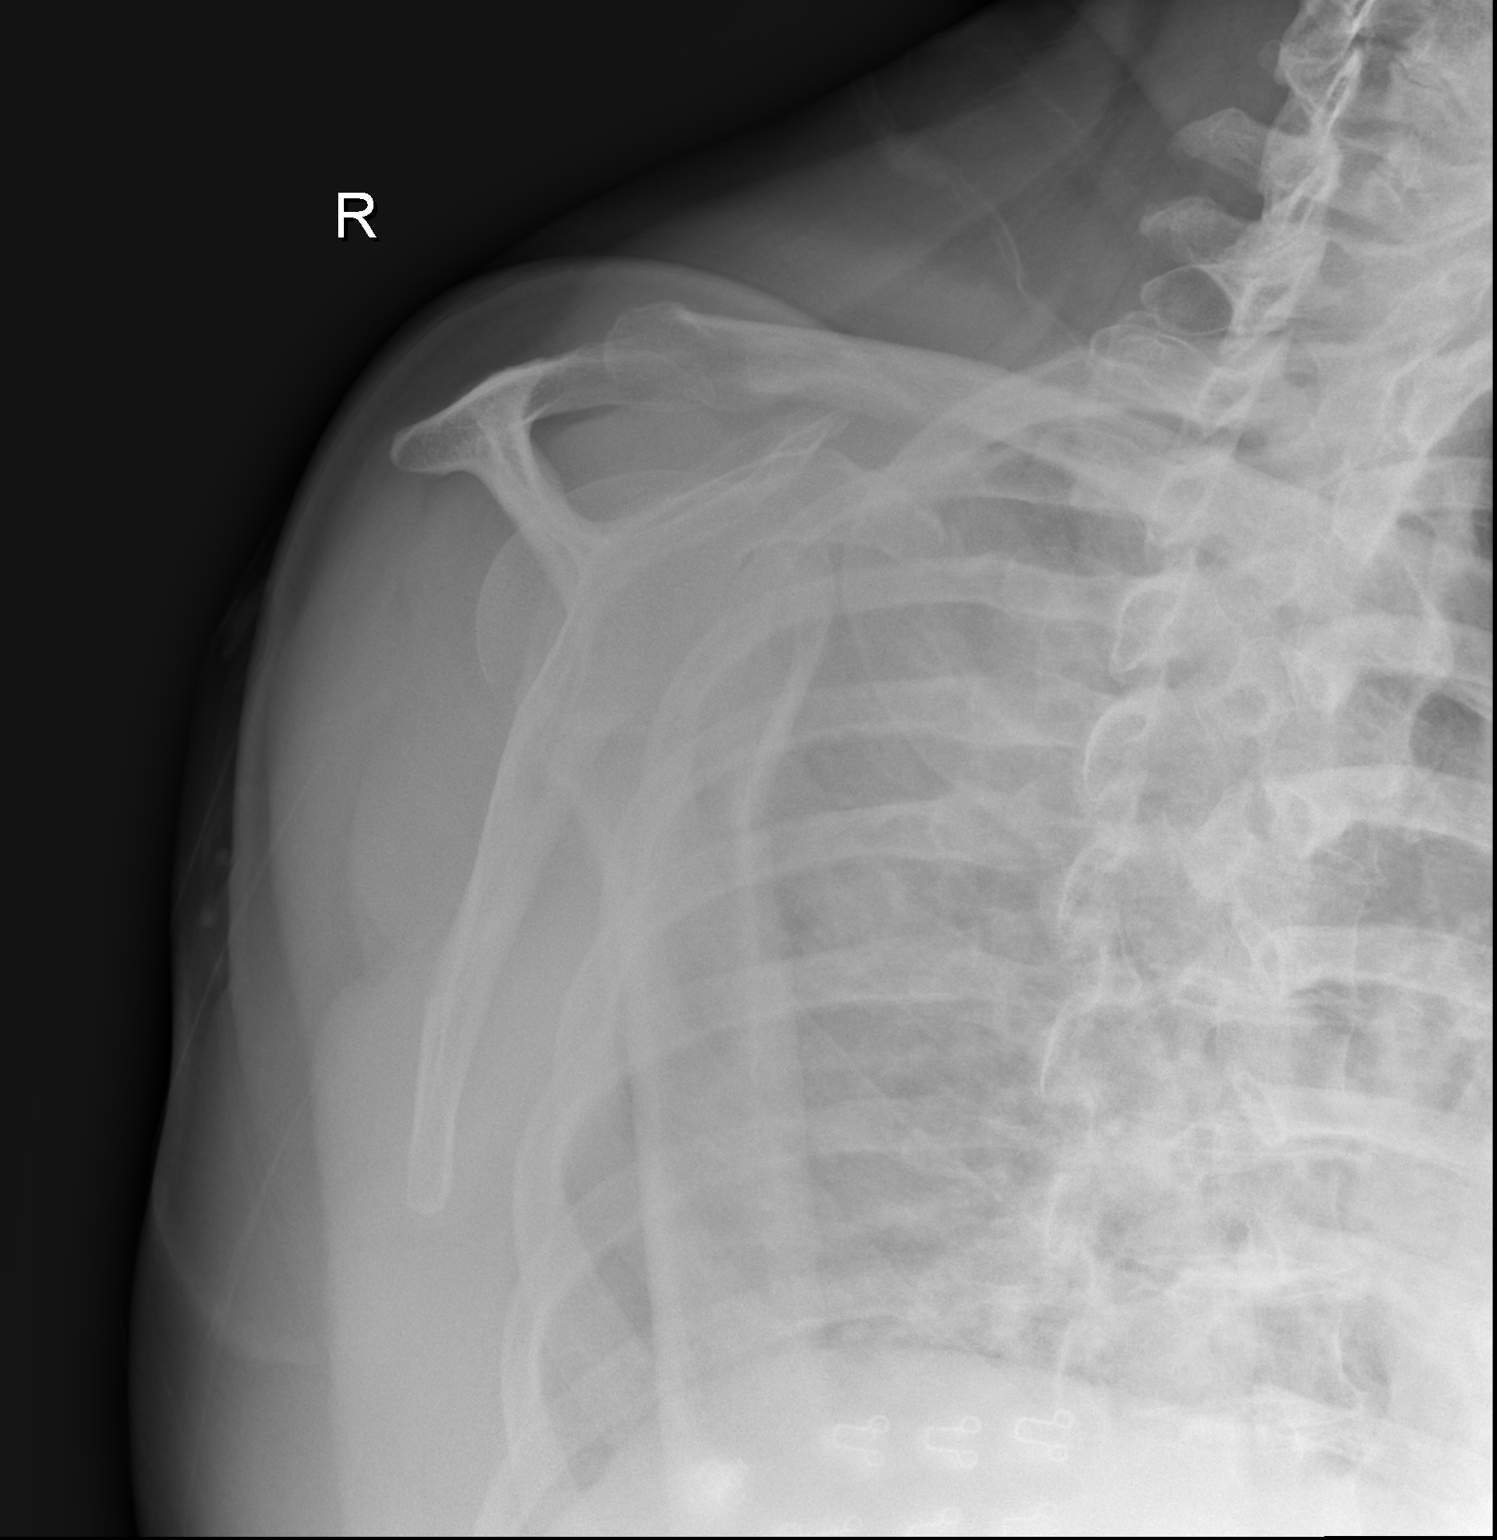

[2 of 2 positions shown; findings below may reference images not displayed]

FINDINGS: No external rotation view is provided, and I suspect that the
patient is probably unable to assume the external rotation view.

There is cortical discontinuity posteriorly along the surgical neck
of the humerus favoring a surgical neck fracture. This is only seen
on the internal rotation view. The transscapular view is blurred but
indicates normal glenohumeral alignment. AC joint intact.

Left basilar bandlike density favoring plate like atelectasis.
IMPRESSION: 1. Relatively nondisplaced surgical neck fracture of the right
proximal humerus. This is poorly characterized as the patient was
unable to assume a normal external rotation view.
2. Subsegmental atelectasis at the right lung base.

## 2019-01-02 DIAGNOSIS — Z803 Family history of malignant neoplasm of breast: Secondary | ICD-10-CM | POA: Diagnosis not present

## 2019-01-02 DIAGNOSIS — Z1231 Encounter for screening mammogram for malignant neoplasm of breast: Secondary | ICD-10-CM | POA: Diagnosis not present

## 2019-03-01 DIAGNOSIS — Z23 Encounter for immunization: Secondary | ICD-10-CM | POA: Diagnosis not present

## 2019-03-06 DIAGNOSIS — N183 Chronic kidney disease, stage 3 unspecified: Secondary | ICD-10-CM | POA: Diagnosis not present

## 2019-03-06 DIAGNOSIS — R7303 Prediabetes: Secondary | ICD-10-CM | POA: Diagnosis not present

## 2019-03-06 DIAGNOSIS — E559 Vitamin D deficiency, unspecified: Secondary | ICD-10-CM | POA: Diagnosis not present

## 2019-03-06 DIAGNOSIS — E785 Hyperlipidemia, unspecified: Secondary | ICD-10-CM | POA: Diagnosis not present

## 2019-03-06 DIAGNOSIS — Z0000 Encounter for general adult medical examination without abnormal findings: Secondary | ICD-10-CM | POA: Diagnosis not present

## 2019-03-06 DIAGNOSIS — E039 Hypothyroidism, unspecified: Secondary | ICD-10-CM | POA: Diagnosis not present

## 2019-03-06 DIAGNOSIS — I10 Essential (primary) hypertension: Secondary | ICD-10-CM | POA: Diagnosis not present

## 2019-06-07 ENCOUNTER — Ambulatory Visit: Payer: Medicare Other | Attending: Internal Medicine

## 2019-06-07 DIAGNOSIS — Z23 Encounter for immunization: Secondary | ICD-10-CM | POA: Insufficient documentation

## 2019-06-07 NOTE — Progress Notes (Signed)
   Covid-19 Vaccination Clinic  Name:  Carmen [name]    MRN: [id number] DOB: 1945/12/21  06/07/2019  Carmen [name] was observed post Covid-19 immunization for 15 minutes without incidence. She was provided with Vaccine Information Sheet and instruction to access the V-Safe system.   Carmen [name] was instructed to call 911 with any severe reactions post vaccine: Marland Kitchen Difficulty breathing  . Swelling of your face and throat  . A fast heartbeat  . A bad rash all over your body  . Dizziness and weakness    Immunizations Administered    Name Date Dose VIS Date Route   Pfizer COVID-19 Vaccine 06/07/2019  4:02 PM 0.3 mL 04/27/2019 Intramuscular   Manufacturer: [place]   Lot: [id number]   [place]: [id number]

## 2019-06-27 DIAGNOSIS — H01021 Squamous blepharitis right upper eyelid: Secondary | ICD-10-CM | POA: Diagnosis not present

## 2019-06-28 ENCOUNTER — Ambulatory Visit: Payer: Medicare Other | Attending: Internal Medicine

## 2019-06-28 DIAGNOSIS — Z23 Encounter for immunization: Secondary | ICD-10-CM | POA: Insufficient documentation

## 2019-06-28 NOTE — Progress Notes (Signed)
   Covid-19 Vaccination Clinic  Name:  Carmen [name]    MRN: [id number] DOB: 1945/12/17  06/28/2019  [name] was observed post Covid-19 immunization for 15 minutes without incidence. She was provided with Vaccine Information Sheet and instruction to access the V-Safe system.   [name] was instructed to call 911 with any severe reactions post vaccine: Marland Kitchen Difficulty breathing  . Swelling of your face and throat  . A fast heartbeat  . A bad rash all over your body  . Dizziness and weakness    Immunizations Administered    Name Date Dose VIS Date Route   Pfizer COVID-19 Vaccine 06/28/2019  5:14 PM 0.3 mL 04/27/2019 Intramuscular   Manufacturer: [place]   Lot: [id number]   [place]: [id number]

## 2019-07-30 DIAGNOSIS — H01021 Squamous blepharitis right upper eyelid: Secondary | ICD-10-CM | POA: Diagnosis not present

## 2019-09-26 DIAGNOSIS — L309 Dermatitis, unspecified: Secondary | ICD-10-CM | POA: Diagnosis not present

## 2019-10-10 DIAGNOSIS — H10533 Contact blepharoconjunctivitis, bilateral: Secondary | ICD-10-CM | POA: Diagnosis not present

## 2019-10-10 DIAGNOSIS — H1045 Other chronic allergic conjunctivitis: Secondary | ICD-10-CM | POA: Diagnosis not present

## 2019-12-17 DIAGNOSIS — L821 Other seborrheic keratosis: Secondary | ICD-10-CM | POA: Diagnosis not present

## 2019-12-17 DIAGNOSIS — L309 Dermatitis, unspecified: Secondary | ICD-10-CM | POA: Diagnosis not present

## 2020-01-08 DIAGNOSIS — Z1231 Encounter for screening mammogram for malignant neoplasm of breast: Secondary | ICD-10-CM | POA: Diagnosis not present

## 2020-01-08 DIAGNOSIS — M85851 Other specified disorders of bone density and structure, right thigh: Secondary | ICD-10-CM | POA: Diagnosis not present

## 2020-01-08 DIAGNOSIS — M85852 Other specified disorders of bone density and structure, left thigh: Secondary | ICD-10-CM | POA: Diagnosis not present

## 2020-01-11 DIAGNOSIS — L309 Dermatitis, unspecified: Secondary | ICD-10-CM | POA: Diagnosis not present

## 2020-01-11 DIAGNOSIS — L981 Factitial dermatitis: Secondary | ICD-10-CM | POA: Diagnosis not present

## 2020-02-08 DIAGNOSIS — L981 Factitial dermatitis: Secondary | ICD-10-CM | POA: Diagnosis not present

## 2020-02-08 DIAGNOSIS — L309 Dermatitis, unspecified: Secondary | ICD-10-CM | POA: Diagnosis not present

## 2020-02-09 DIAGNOSIS — Z23 Encounter for immunization: Secondary | ICD-10-CM | POA: Diagnosis not present

## 2020-02-16 DIAGNOSIS — Z23 Encounter for immunization: Secondary | ICD-10-CM | POA: Diagnosis not present

## 2020-03-10 DIAGNOSIS — E785 Hyperlipidemia, unspecified: Secondary | ICD-10-CM | POA: Diagnosis not present

## 2020-03-10 DIAGNOSIS — E559 Vitamin D deficiency, unspecified: Secondary | ICD-10-CM | POA: Diagnosis not present

## 2020-03-10 DIAGNOSIS — Z0000 Encounter for general adult medical examination without abnormal findings: Secondary | ICD-10-CM | POA: Diagnosis not present

## 2020-03-10 DIAGNOSIS — R7303 Prediabetes: Secondary | ICD-10-CM | POA: Diagnosis not present

## 2020-03-10 DIAGNOSIS — E039 Hypothyroidism, unspecified: Secondary | ICD-10-CM | POA: Diagnosis not present

## 2020-03-10 DIAGNOSIS — I10 Essential (primary) hypertension: Secondary | ICD-10-CM | POA: Diagnosis not present

## 2020-03-21 DIAGNOSIS — L309 Dermatitis, unspecified: Secondary | ICD-10-CM | POA: Diagnosis not present

## 2020-03-21 DIAGNOSIS — L981 Factitial dermatitis: Secondary | ICD-10-CM | POA: Diagnosis not present

## 2020-04-17 DIAGNOSIS — H0288B Meibomian gland dysfunction left eye, upper and lower eyelids: Secondary | ICD-10-CM | POA: Diagnosis not present

## 2020-04-17 DIAGNOSIS — H40033 Anatomical narrow angle, bilateral: Secondary | ICD-10-CM | POA: Diagnosis not present

## 2020-04-17 DIAGNOSIS — H04123 Dry eye syndrome of bilateral lacrimal glands: Secondary | ICD-10-CM | POA: Diagnosis not present

## 2020-04-17 DIAGNOSIS — H40013 Open angle with borderline findings, low risk, bilateral: Secondary | ICD-10-CM | POA: Diagnosis not present

## 2020-04-17 DIAGNOSIS — H0102A Squamous blepharitis right eye, upper and lower eyelids: Secondary | ICD-10-CM | POA: Diagnosis not present

## 2020-04-17 DIAGNOSIS — H0102B Squamous blepharitis left eye, upper and lower eyelids: Secondary | ICD-10-CM | POA: Diagnosis not present

## 2020-04-17 DIAGNOSIS — H1045 Other chronic allergic conjunctivitis: Secondary | ICD-10-CM | POA: Diagnosis not present

## 2020-04-17 DIAGNOSIS — H0288A Meibomian gland dysfunction right eye, upper and lower eyelids: Secondary | ICD-10-CM | POA: Diagnosis not present

## 2020-04-17 DIAGNOSIS — H25813 Combined forms of age-related cataract, bilateral: Secondary | ICD-10-CM | POA: Diagnosis not present

## 2020-04-18 DIAGNOSIS — Z20822 Contact with and (suspected) exposure to covid-19: Secondary | ICD-10-CM | POA: Diagnosis not present

## 2020-07-08 DIAGNOSIS — L309 Dermatitis, unspecified: Secondary | ICD-10-CM | POA: Diagnosis not present

## 2020-07-08 DIAGNOSIS — D485 Neoplasm of uncertain behavior of skin: Secondary | ICD-10-CM | POA: Diagnosis not present

## 2020-07-08 DIAGNOSIS — L570 Actinic keratosis: Secondary | ICD-10-CM | POA: Diagnosis not present

## 2020-07-25 DIAGNOSIS — L570 Actinic keratosis: Secondary | ICD-10-CM | POA: Diagnosis not present

## 2020-07-25 DIAGNOSIS — L2084 Intrinsic (allergic) eczema: Secondary | ICD-10-CM | POA: Diagnosis not present

## 2020-08-05 DIAGNOSIS — L249 Irritant contact dermatitis, unspecified cause: Secondary | ICD-10-CM | POA: Diagnosis not present

## 2020-08-11 DIAGNOSIS — L249 Irritant contact dermatitis, unspecified cause: Secondary | ICD-10-CM | POA: Diagnosis not present

## 2020-08-11 DIAGNOSIS — L2084 Intrinsic (allergic) eczema: Secondary | ICD-10-CM | POA: Diagnosis not present

## 2020-08-14 DIAGNOSIS — Z20822 Contact with and (suspected) exposure to covid-19: Secondary | ICD-10-CM | POA: Diagnosis not present

## 2020-08-27 DIAGNOSIS — R252 Cramp and spasm: Secondary | ICD-10-CM | POA: Diagnosis not present

## 2020-08-27 DIAGNOSIS — R059 Cough, unspecified: Secondary | ICD-10-CM | POA: Diagnosis not present

## 2020-08-27 DIAGNOSIS — J019 Acute sinusitis, unspecified: Secondary | ICD-10-CM | POA: Diagnosis not present

## 2020-08-28 DIAGNOSIS — J019 Acute sinusitis, unspecified: Secondary | ICD-10-CM | POA: Diagnosis not present

## 2020-08-28 DIAGNOSIS — Z03818 Encounter for observation for suspected exposure to other biological agents ruled out: Secondary | ICD-10-CM | POA: Diagnosis not present

## 2020-08-28 DIAGNOSIS — R059 Cough, unspecified: Secondary | ICD-10-CM | POA: Diagnosis not present

## 2020-09-11 DIAGNOSIS — L209 Atopic dermatitis, unspecified: Secondary | ICD-10-CM | POA: Diagnosis not present

## 2020-09-25 DIAGNOSIS — L2084 Intrinsic (allergic) eczema: Secondary | ICD-10-CM | POA: Diagnosis not present

## 2020-11-06 DIAGNOSIS — H01134 Eczematous dermatitis of left upper eyelid: Secondary | ICD-10-CM | POA: Diagnosis not present

## 2020-11-06 DIAGNOSIS — H0288A Meibomian gland dysfunction right eye, upper and lower eyelids: Secondary | ICD-10-CM | POA: Diagnosis not present

## 2020-11-06 DIAGNOSIS — H01135 Eczematous dermatitis of left lower eyelid: Secondary | ICD-10-CM | POA: Diagnosis not present

## 2020-11-06 DIAGNOSIS — H01131 Eczematous dermatitis of right upper eyelid: Secondary | ICD-10-CM | POA: Diagnosis not present

## 2020-11-06 DIAGNOSIS — H0102B Squamous blepharitis left eye, upper and lower eyelids: Secondary | ICD-10-CM | POA: Diagnosis not present

## 2020-11-06 DIAGNOSIS — H04123 Dry eye syndrome of bilateral lacrimal glands: Secondary | ICD-10-CM | POA: Diagnosis not present

## 2020-11-06 DIAGNOSIS — H0102A Squamous blepharitis right eye, upper and lower eyelids: Secondary | ICD-10-CM | POA: Diagnosis not present

## 2020-11-06 DIAGNOSIS — H01132 Eczematous dermatitis of right lower eyelid: Secondary | ICD-10-CM | POA: Diagnosis not present

## 2020-11-06 DIAGNOSIS — H0288B Meibomian gland dysfunction left eye, upper and lower eyelids: Secondary | ICD-10-CM | POA: Diagnosis not present

## 2020-11-06 DIAGNOSIS — H25813 Combined forms of age-related cataract, bilateral: Secondary | ICD-10-CM | POA: Diagnosis not present

## 2020-11-06 DIAGNOSIS — H1045 Other chronic allergic conjunctivitis: Secondary | ICD-10-CM | POA: Diagnosis not present

## 2020-11-19 DIAGNOSIS — L738 Other specified follicular disorders: Secondary | ICD-10-CM | POA: Diagnosis not present

## 2020-11-19 DIAGNOSIS — L981 Factitial dermatitis: Secondary | ICD-10-CM | POA: Diagnosis not present

## 2020-11-19 DIAGNOSIS — L2084 Intrinsic (allergic) eczema: Secondary | ICD-10-CM | POA: Diagnosis not present

## 2020-11-19 DIAGNOSIS — L820 Inflamed seborrheic keratosis: Secondary | ICD-10-CM | POA: Diagnosis not present

## 2020-11-19 DIAGNOSIS — L308 Other specified dermatitis: Secondary | ICD-10-CM | POA: Diagnosis not present

## 2020-11-21 DIAGNOSIS — Z23 Encounter for immunization: Secondary | ICD-10-CM | POA: Diagnosis not present

## 2020-11-27 DIAGNOSIS — H01135 Eczematous dermatitis of left lower eyelid: Secondary | ICD-10-CM | POA: Diagnosis not present

## 2020-11-27 DIAGNOSIS — H0288B Meibomian gland dysfunction left eye, upper and lower eyelids: Secondary | ICD-10-CM | POA: Diagnosis not present

## 2020-11-27 DIAGNOSIS — H04123 Dry eye syndrome of bilateral lacrimal glands: Secondary | ICD-10-CM | POA: Diagnosis not present

## 2020-11-27 DIAGNOSIS — H1045 Other chronic allergic conjunctivitis: Secondary | ICD-10-CM | POA: Diagnosis not present

## 2020-11-27 DIAGNOSIS — H01132 Eczematous dermatitis of right lower eyelid: Secondary | ICD-10-CM | POA: Diagnosis not present

## 2020-11-27 DIAGNOSIS — H0102B Squamous blepharitis left eye, upper and lower eyelids: Secondary | ICD-10-CM | POA: Diagnosis not present

## 2020-11-27 DIAGNOSIS — H0288A Meibomian gland dysfunction right eye, upper and lower eyelids: Secondary | ICD-10-CM | POA: Diagnosis not present

## 2020-11-27 DIAGNOSIS — H01131 Eczematous dermatitis of right upper eyelid: Secondary | ICD-10-CM | POA: Diagnosis not present

## 2020-11-27 DIAGNOSIS — H0102A Squamous blepharitis right eye, upper and lower eyelids: Secondary | ICD-10-CM | POA: Diagnosis not present

## 2020-11-27 DIAGNOSIS — H01134 Eczematous dermatitis of left upper eyelid: Secondary | ICD-10-CM | POA: Diagnosis not present

## 2020-11-27 DIAGNOSIS — H25813 Combined forms of age-related cataract, bilateral: Secondary | ICD-10-CM | POA: Diagnosis not present

## 2021-01-13 DIAGNOSIS — Z1231 Encounter for screening mammogram for malignant neoplasm of breast: Secondary | ICD-10-CM | POA: Diagnosis not present

## 2021-01-30 DIAGNOSIS — Z23 Encounter for immunization: Secondary | ICD-10-CM | POA: Diagnosis not present

## 2021-03-02 DIAGNOSIS — Z20822 Contact with and (suspected) exposure to covid-19: Secondary | ICD-10-CM | POA: Diagnosis not present

## 2021-03-27 DIAGNOSIS — Z79899 Other long term (current) drug therapy: Secondary | ICD-10-CM | POA: Diagnosis not present

## 2021-03-27 DIAGNOSIS — S1080XA Unspecified superficial injury of other specified part of neck, initial encounter: Secondary | ICD-10-CM | POA: Diagnosis not present

## 2021-03-27 DIAGNOSIS — S20409A Unspecified superficial injuries of unspecified back wall of thorax, initial encounter: Secondary | ICD-10-CM | POA: Diagnosis not present

## 2021-03-27 DIAGNOSIS — S0080XA Unspecified superficial injury of other part of head, initial encounter: Secondary | ICD-10-CM | POA: Diagnosis not present

## 2021-03-27 DIAGNOSIS — L2089 Other atopic dermatitis: Secondary | ICD-10-CM | POA: Diagnosis not present

## 2021-03-27 DIAGNOSIS — L570 Actinic keratosis: Secondary | ICD-10-CM | POA: Diagnosis not present

## 2021-04-17 DIAGNOSIS — H0102B Squamous blepharitis left eye, upper and lower eyelids: Secondary | ICD-10-CM | POA: Diagnosis not present

## 2021-04-17 DIAGNOSIS — H5203 Hypermetropia, bilateral: Secondary | ICD-10-CM | POA: Diagnosis not present

## 2021-04-17 DIAGNOSIS — H04123 Dry eye syndrome of bilateral lacrimal glands: Secondary | ICD-10-CM | POA: Diagnosis not present

## 2021-04-17 DIAGNOSIS — H02831 Dermatochalasis of right upper eyelid: Secondary | ICD-10-CM | POA: Diagnosis not present

## 2021-04-17 DIAGNOSIS — H40013 Open angle with borderline findings, low risk, bilateral: Secondary | ICD-10-CM | POA: Diagnosis not present

## 2021-04-17 DIAGNOSIS — H0288B Meibomian gland dysfunction left eye, upper and lower eyelids: Secondary | ICD-10-CM | POA: Diagnosis not present

## 2021-04-17 DIAGNOSIS — H0102A Squamous blepharitis right eye, upper and lower eyelids: Secondary | ICD-10-CM | POA: Diagnosis not present

## 2021-04-17 DIAGNOSIS — H1045 Other chronic allergic conjunctivitis: Secondary | ICD-10-CM | POA: Diagnosis not present

## 2021-04-17 DIAGNOSIS — H02834 Dermatochalasis of left upper eyelid: Secondary | ICD-10-CM | POA: Diagnosis not present

## 2021-04-17 DIAGNOSIS — H0288A Meibomian gland dysfunction right eye, upper and lower eyelids: Secondary | ICD-10-CM | POA: Diagnosis not present

## 2021-04-17 DIAGNOSIS — H25813 Combined forms of age-related cataract, bilateral: Secondary | ICD-10-CM | POA: Diagnosis not present
